# Patient Record
Sex: Female | Born: 1965
Health system: Southern US, Community
[De-identification: ages and names within clinical notes are randomized; demographics above are authoritative.]

## PROBLEM LIST (undated history)

## (undated) DIAGNOSIS — E559 Vitamin D deficiency, unspecified: Secondary | ICD-10-CM

## (undated) DIAGNOSIS — S92909A Unspecified fracture of unspecified foot, initial encounter for closed fracture: Secondary | ICD-10-CM

## (undated) DIAGNOSIS — M810 Age-related osteoporosis without current pathological fracture: Secondary | ICD-10-CM

## (undated) DIAGNOSIS — Z9109 Other allergy status, other than to drugs and biological substances: Secondary | ICD-10-CM

## (undated) HISTORY — DX: Other allergy status, other than to drugs and biological substances: Z91.09

## (undated) HISTORY — DX: Unspecified fracture of unspecified foot, initial encounter for closed fracture: S92.909A

## (undated) HISTORY — DX: Age-related osteoporosis without current pathological fracture: M81.0

## (undated) HISTORY — DX: Vitamin D deficiency, unspecified: E55.9

## (undated) HISTORY — PX: UMBILICAL HERNIA REPAIR: SHX196

---

## 1999-02-16 ENCOUNTER — Other Ambulatory Visit: Admission: RE | Admit: 1999-02-16 | Discharge: 1999-02-16 | Payer: Self-pay | Admitting: Gynecology

## 2002-02-25 ENCOUNTER — Other Ambulatory Visit: Admission: RE | Admit: 2002-02-25 | Discharge: 2002-02-25 | Payer: Self-pay | Admitting: Obstetrics and Gynecology

## 2002-10-02 HISTORY — PX: TONSILLECTOMY: SHX5217

## 2003-02-26 ENCOUNTER — Other Ambulatory Visit: Admission: RE | Admit: 2003-02-26 | Discharge: 2003-02-26 | Payer: Self-pay | Admitting: Obstetrics and Gynecology

## 2004-03-28 ENCOUNTER — Other Ambulatory Visit: Admission: RE | Admit: 2004-03-28 | Discharge: 2004-03-28 | Payer: Self-pay | Admitting: Gynecology

## 2005-03-29 ENCOUNTER — Other Ambulatory Visit: Admission: RE | Admit: 2005-03-29 | Discharge: 2005-03-29 | Payer: Self-pay | Admitting: Gynecology

## 2005-10-02 HISTORY — PX: GALLBLADDER SURGERY: SHX652

## 2006-03-30 ENCOUNTER — Other Ambulatory Visit: Admission: RE | Admit: 2006-03-30 | Discharge: 2006-03-30 | Payer: Self-pay | Admitting: Gynecology

## 2006-08-02 ENCOUNTER — Ambulatory Visit (HOSPITAL_COMMUNITY): Admission: RE | Admit: 2006-08-02 | Discharge: 2006-08-02 | Payer: Self-pay | Admitting: Gynecology

## 2006-08-16 ENCOUNTER — Encounter: Admission: RE | Admit: 2006-08-16 | Discharge: 2006-08-16 | Payer: Self-pay | Admitting: Gynecology

## 2007-04-01 ENCOUNTER — Other Ambulatory Visit: Admission: RE | Admit: 2007-04-01 | Discharge: 2007-04-01 | Payer: Self-pay | Admitting: Gynecology

## 2007-10-03 HISTORY — PX: CERVICAL DISC SURGERY: SHX588

## 2008-05-26 ENCOUNTER — Ambulatory Visit (HOSPITAL_COMMUNITY): Admission: RE | Admit: 2008-05-26 | Discharge: 2008-05-27 | Payer: Self-pay | Admitting: Neurosurgery

## 2008-07-15 ENCOUNTER — Ambulatory Visit: Payer: Self-pay | Admitting: Gynecology

## 2008-07-15 ENCOUNTER — Other Ambulatory Visit: Admission: RE | Admit: 2008-07-15 | Discharge: 2008-07-15 | Payer: Self-pay | Admitting: Gynecology

## 2008-07-15 ENCOUNTER — Encounter: Payer: Self-pay | Admitting: Gynecology

## 2008-09-10 ENCOUNTER — Ambulatory Visit (HOSPITAL_COMMUNITY): Admission: RE | Admit: 2008-09-10 | Discharge: 2008-09-10 | Payer: Self-pay | Admitting: Gynecology

## 2008-10-02 HISTORY — PX: ENDOMETRIAL ABLATION: SHX621

## 2009-05-25 IMAGING — MG MM DIGITAL SCREENING BILAT
5 series · 5 of 5 positions shown · non-contrast
Comparison: none

DG SCREEN MAMMOGRAM BILATERAL
Bilateral CC and MLO view(s) were taken.

DIGITAL SCREENING MAMMOGRAM WITH CAD:
The breast tissue is heterogeneously dense.  No masses or malignant type calcifications are 
identified.  Compared with prior studies.

[R CC]
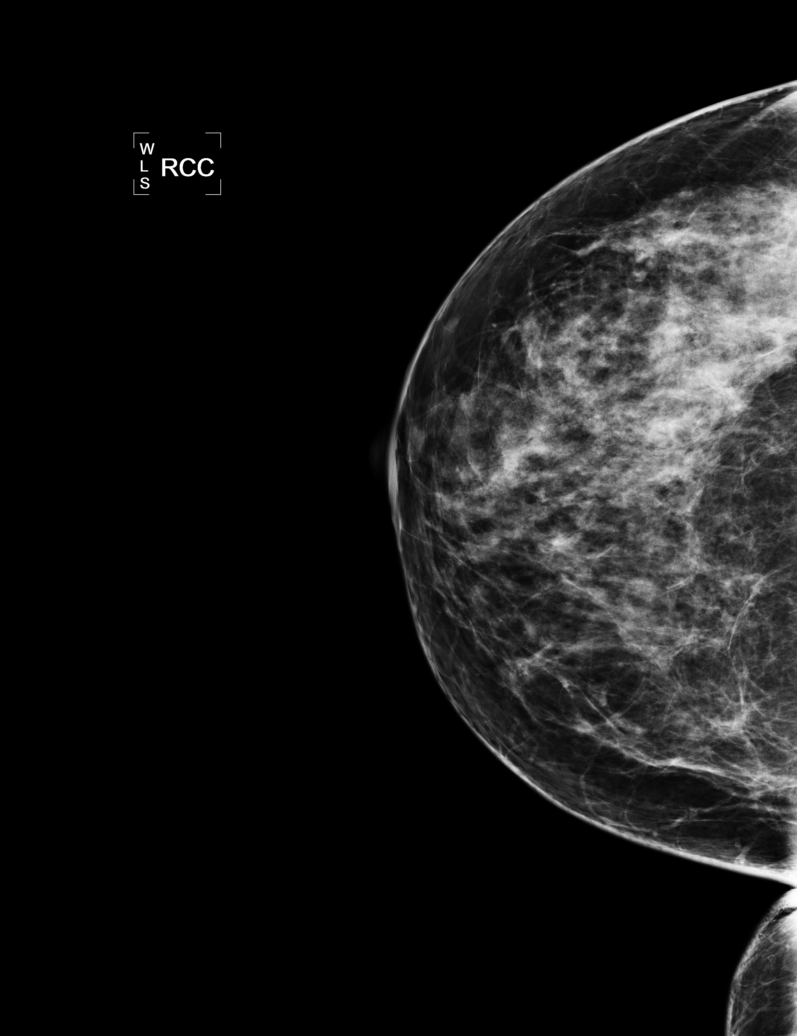

[R MLO (1 of 2)]
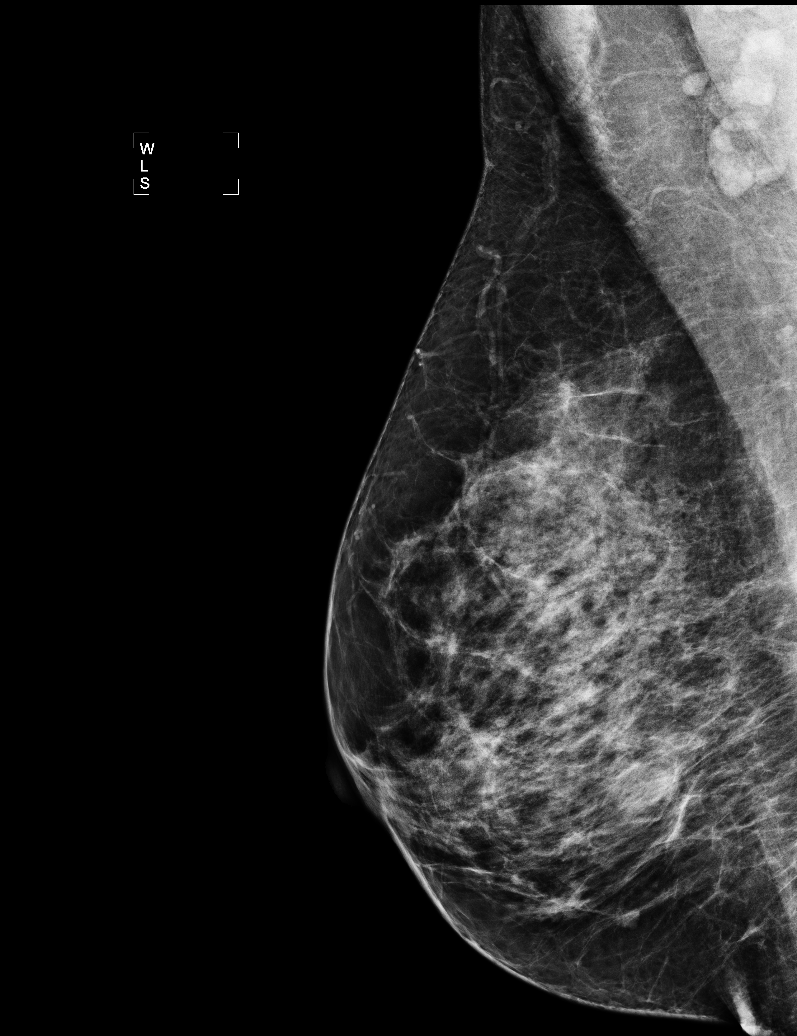

[L CC]
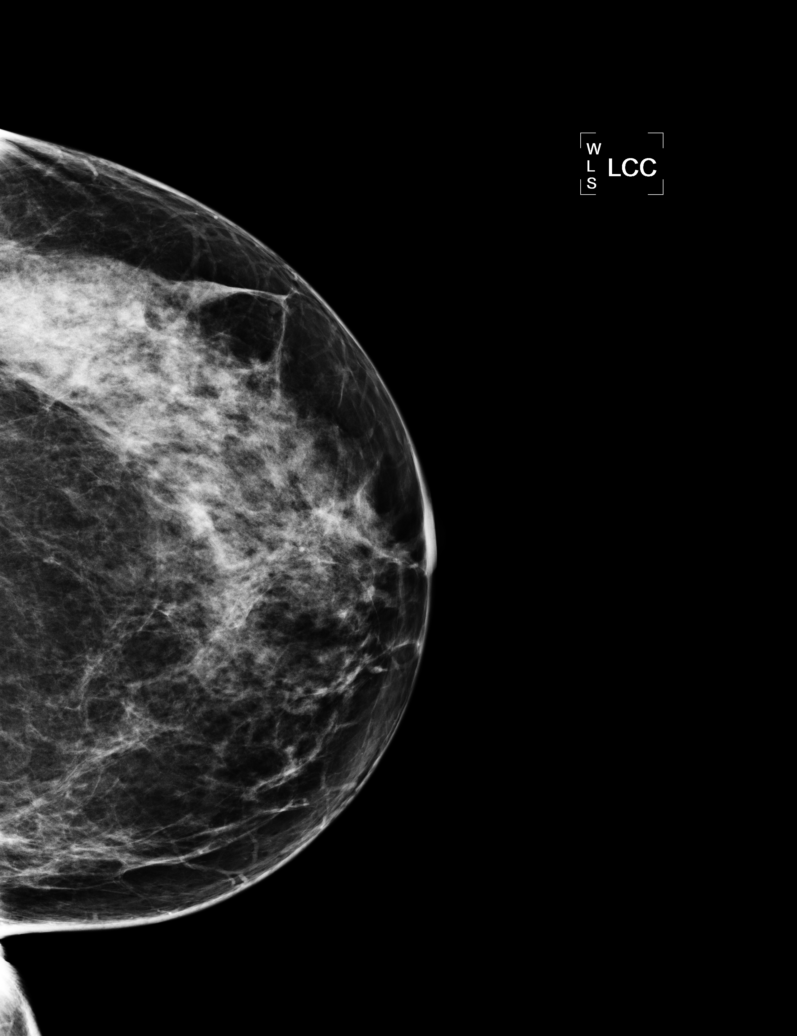

[L MLO]
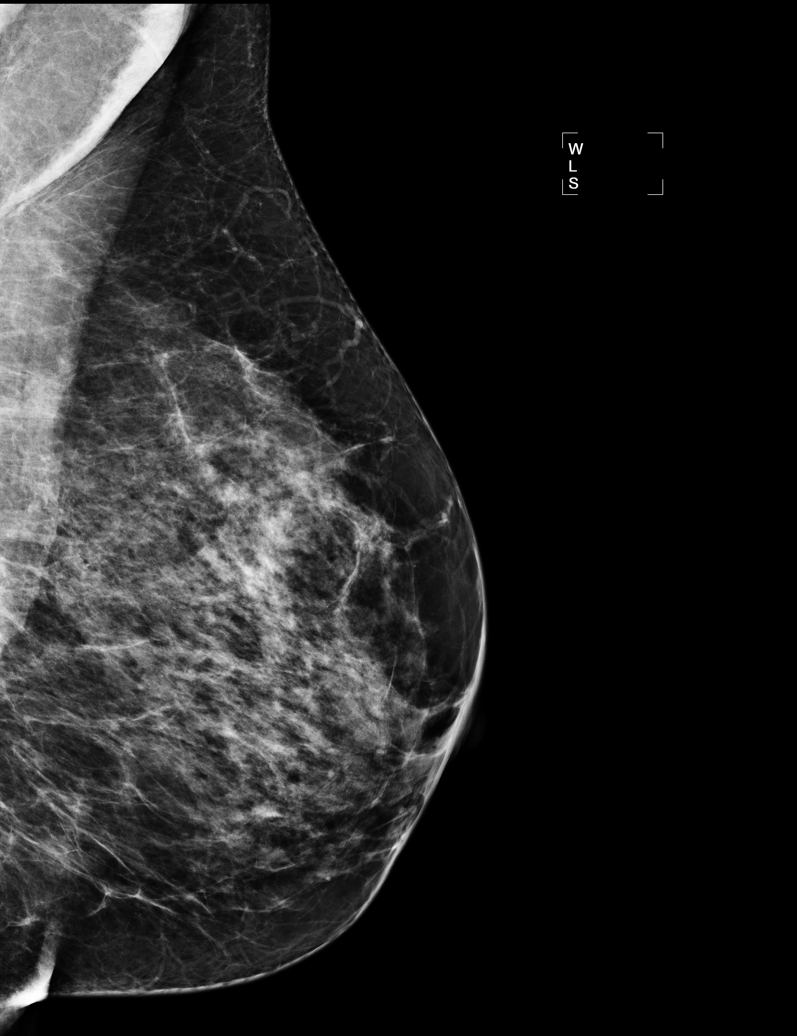

[R MLO (2 of 2)]
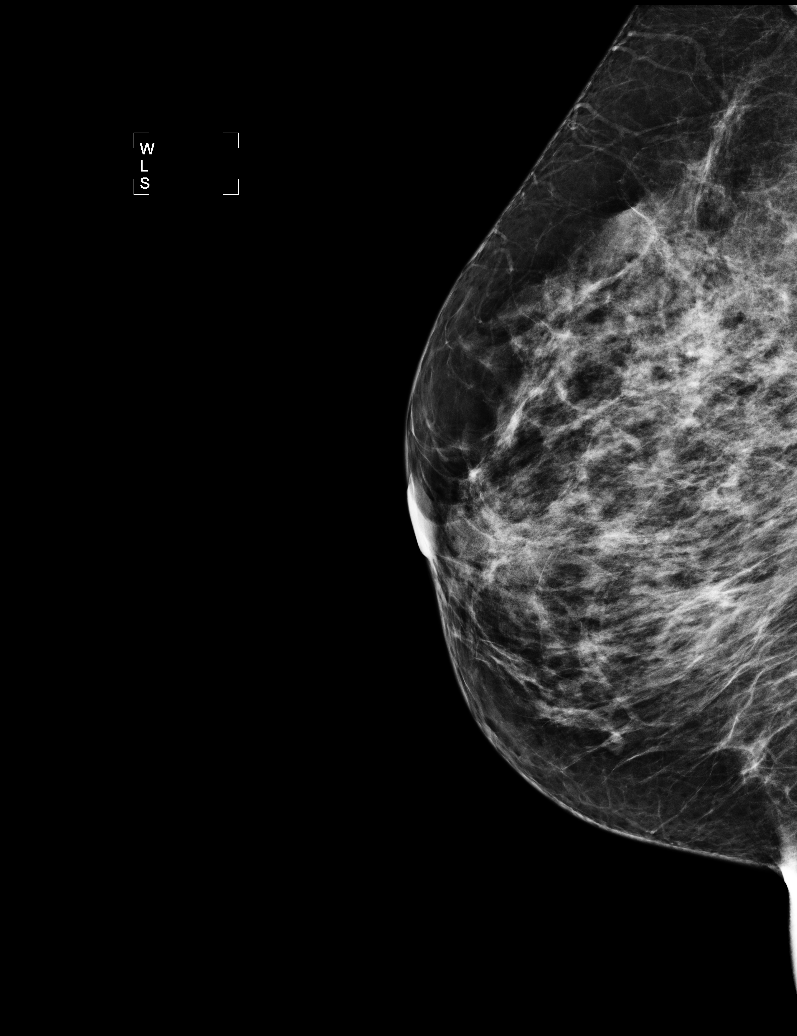

[5 of 5 positions shown; findings below may reference images not displayed]

IMPRESSION: No specific mammographic evidence of malignancy.  Next screening mammogram is recommended in one 
year.

ASSESSMENT: Negative - BI-RADS 1

Screening mammogram in 1 year.
ANALYZED BY COMPUTER AIDED DETECTION. , THIS PROCEDURE WAS A DIGITAL MAMMOGRAM.

## 2009-06-02 ENCOUNTER — Ambulatory Visit: Payer: Self-pay | Admitting: Gynecology

## 2009-06-08 ENCOUNTER — Ambulatory Visit: Payer: Self-pay | Admitting: Gynecology

## 2009-06-17 ENCOUNTER — Ambulatory Visit: Payer: Self-pay | Admitting: Gynecology

## 2009-06-18 ENCOUNTER — Ambulatory Visit (HOSPITAL_BASED_OUTPATIENT_CLINIC_OR_DEPARTMENT_OTHER): Admission: RE | Admit: 2009-06-18 | Discharge: 2009-06-18 | Payer: Self-pay | Admitting: Gynecology

## 2009-06-18 ENCOUNTER — Ambulatory Visit: Payer: Self-pay | Admitting: Gynecology

## 2009-07-29 ENCOUNTER — Encounter: Payer: Self-pay | Admitting: Gynecology

## 2009-07-29 ENCOUNTER — Other Ambulatory Visit: Admission: RE | Admit: 2009-07-29 | Discharge: 2009-07-29 | Payer: Self-pay | Admitting: Gynecology

## 2009-07-29 ENCOUNTER — Ambulatory Visit: Payer: Self-pay | Admitting: Gynecology

## 2009-10-07 ENCOUNTER — Ambulatory Visit (HOSPITAL_COMMUNITY): Admission: RE | Admit: 2009-10-07 | Discharge: 2009-10-07 | Payer: Self-pay | Admitting: Gynecology

## 2009-11-18 ENCOUNTER — Ambulatory Visit: Payer: Self-pay | Admitting: Gynecology

## 2009-11-25 ENCOUNTER — Ambulatory Visit: Payer: Self-pay | Admitting: Gynecology

## 2009-11-25 ENCOUNTER — Ambulatory Visit (HOSPITAL_BASED_OUTPATIENT_CLINIC_OR_DEPARTMENT_OTHER): Admission: RE | Admit: 2009-11-25 | Discharge: 2009-11-26 | Payer: Self-pay | Admitting: Gynecology

## 2009-11-25 HISTORY — PX: VAGINAL HYSTERECTOMY: SUR661

## 2009-12-08 ENCOUNTER — Ambulatory Visit: Payer: Self-pay | Admitting: Gynecology

## 2010-01-05 ENCOUNTER — Ambulatory Visit: Payer: Self-pay | Admitting: Gynecology

## 2010-07-04 ENCOUNTER — Encounter: Admission: RE | Admit: 2010-07-04 | Discharge: 2010-07-04 | Payer: Self-pay

## 2010-10-23 ENCOUNTER — Encounter: Payer: Self-pay | Admitting: Gynecology

## 2010-12-21 LAB — CBC
MCHC: 33.9 g/dL (ref 30.0–36.0)
MCV: 88.9 fL (ref 78.0–100.0)
Platelets: 246 10*3/uL (ref 150–400)

## 2011-01-06 LAB — POCT I-STAT 4, (NA,K, GLUC, HGB,HCT)
Glucose, Bld: 77 mg/dL (ref 70–99)
HCT: 47 % — ABNORMAL HIGH (ref 36.0–46.0)
Hemoglobin: 16 g/dL — ABNORMAL HIGH (ref 12.0–15.0)
Sodium: 140 mEq/L (ref 135–145)

## 2011-01-06 LAB — URINALYSIS, MICROSCOPIC ONLY
Ketones, ur: NEGATIVE mg/dL
Leukocytes, UA: NEGATIVE
Nitrite: NEGATIVE
Protein, ur: NEGATIVE mg/dL

## 2011-02-14 NOTE — Op Note (Signed)
NAMEKENNISHA, Allison Neal             ACCOUNT NO.:  [id number]   MEDICAL RECORD NO.:  [id number]          PATIENT TYPE:  OIB   LOCATION:  3523                         FACILITY:  [facility]   PHYSICIAN:  Cristi Loron, M.D.DATE OF BIRTH:  [birth year]   DATE OF PROCEDURE:  05/26/2008  DATE OF DISCHARGE:                               OPERATIVE REPORT   BRIEF HISTORY:  The patient is a 45 year old white female who has  suffered from neck and left arm pain consistent with a left C7  radiculopathy.  She failed medical management, was worked up with a  cervical MRI, which demonstrated a large herniated disk at C6-C7 on  left.  I discussed the various treatment options with the patient  including surgery.  She has weighed the risks, benefits, and  alternatives of surgery, and decided to proceed with a C6-C7 anterior  cervical diskectomy with fusion plating.   PREOPERATIVE DIAGNOSIS:  Left C6-C7 hernia exposed, history of  spondylosis, cervical radiculopathy, and cervicalgia.   POSTOPERATIVE DIAGNOSIS:  Left C6-C7 hernia exposed, history of  spondylosis, cervical radiculopathy, and cervicalgia   PROCEDURE:  C6-C7 extensive anterior cervical diskectomy/decompression;  C6-C7 anterior interbody local autograft arthrodesis; insertion of C6-C7  interbody prosthesis (Alphatec PEEK interbody prosthesis); C6-C7  anterior instrumentation plating with Codman Slim-Loc titanium plate and  screws.   SURGEON:  Cristi Loron, MD.   ASSISTANT:  Payton Doughty, MD.   ANESTHESIA:  General endotracheal.   ESTIMATED BLOOD LOSS:  50 mL.   SPECIMENS:  None.   DRAINS:  None.   COMPLICATIONS:  None.   DESCRIPTION OF PROCEDURE:  The patient was brought to the operative room  by the anesthesia team.  General endotracheal anesthesia was induced.  The patient remain in supine position.  A roll was placed under her  shoulders and placed her neck in slight extension.  Her anterior  cervical region was then  prepared with Betadine scrub with Betadine  solution.  Sterile drapes were applied.  I then injected the area to be  incised with Marcaine with epinephrine solution.  I used a scalpel to  make a transverse incision in the patient's left anterior neck.  I used  the Metzenbaum scissors to divide the platysma muscle and then to  dissect medial sternocleidomastoid muscle, jugular vein, and carotid  artery.  I carefully dissected down towards the anterior cervical spine  identifying the esophagus to retract it medially.  I then used skin  swabs to clear the soft tissue from the anterior cervical spine, and  then inserted bent spinal needle into the exposed intervertebral disk  space.  We obtained intraoperative radiograph to confirm location.   I then used electrocautery to detach the medial border of the longus  colli muscle bilaterally from C6-C7 intervertebral disk space.  I then  inserted the Caspar self-retaining retractor underneath the longus colli  muscle bilaterally to provide exposure and then incised C6-C7  intervertebral disk with a #15 blade scalpel.  Then, performed a partial  intervertebral diskectomy using pituitary forceps.  I then inserted  traction screws to C6-C7, distracted interspace, and then  used a high-  speed drill to decorticate the vertebral endplates at C6-C7.  Drilled  away the remaining of C6-C7 intervertebral disk and thinned out the  posterior longitudinal ligament to remove some ventral spondylosis.  I  then used arachnoid knife to incise and thin out posterior longitudinal  ligament, then removed it with a Kerrison punch undercutting the  vertebral endplates at C6-C7 decompressing the thecal sac.  I then  performed foraminotomies above the bilateral C7 nerve root.  Of note, we  encountered a large herniated disk at C6-C7 on the left as I suspected.  We removed multiple fragments with the pituitary forceps.  We got a good  decompression of thecal sac and  bilateral C7 nerve roots.   We now turned attention to the arthrodesis.  We used the trial spacers  and determined to use a 5 mm medium PEEK interbody prosthesis.  We  prefilled this prosthesis with combination of local autograft bone.  We  obtained the decompression as well as Actifuse bone graft extender.  We  inserted the prosthesis into the distracted C6-C7 interspace, and then  removed distraction screws. There was a good snug fit of the prosthesis.   We now turned attention to the anterior spinal instrumentation.  We  selected the appropriate-length Codman Slim-Loc anterior cervical plate  and we laid it along the anterior aspect of the vertebral bodies at C6-  C7.  We drilled two 12-mm holes at C6-C7 and then secured the plate to  the vertebral bodies by placing two 12-mm screws at C6-C7.  We obtained  intraoperative radiograph, which demonstrated good positioning of  plates, screws, and interbody prosthesis, and secured these screws and  plate by locking each cam.  This completed the instrumentation.   We then obtained hemostasis using bipolar electrocautery.  We irrigated  the wound out with bacitracin solution, removed the retractors.  We then  inspected the esophagus for any damage, there was none apparent.  We  then reapproximated the patient's platysmal muscle with interrupted 3-0  Vicryl suture, the subcutaneous suture with interrupted 3-0 Vicryl  suture, and the skin with Steri-Strips and Benzoin.  The wound was then  coated with bacitracin ointment.  A sterile dressing was applied.  The  drapes were removed.  The patient was subsequently extubated by  anesthesia team and transported to the post anesthesia care unit in  stable condition.  All sponge, instrument, and needle counts were  correct at the end of this case.      Cristi Loron, M.D.  Electronically Signed     JDJ/MEDQ  D:  05/26/2008  T:  05/27/2008  Job:  [id number]

## 2011-12-04 ENCOUNTER — Encounter: Payer: Self-pay | Admitting: Women's Health

## 2011-12-04 ENCOUNTER — Ambulatory Visit (INDEPENDENT_AMBULATORY_CARE_PROVIDER_SITE_OTHER): Payer: BC Managed Care – PPO | Admitting: Women's Health

## 2011-12-04 VITALS — BP 90/62 | Ht 67.0 in | Wt 137.5 lb

## 2011-12-04 DIAGNOSIS — B373 Candidiasis of vulva and vagina: Secondary | ICD-10-CM

## 2011-12-04 DIAGNOSIS — Z13220 Encounter for screening for lipoid disorders: Secondary | ICD-10-CM

## 2011-12-04 DIAGNOSIS — Z833 Family history of diabetes mellitus: Secondary | ICD-10-CM

## 2011-12-04 DIAGNOSIS — Z01419 Encounter for gynecological examination (general) (routine) without abnormal findings: Secondary | ICD-10-CM

## 2011-12-04 LAB — WET PREP FOR TRICH, YEAST, CLUE

## 2011-12-04 MED ORDER — FLUCONAZOLE 150 MG PO TABS: 150.0000 mg | ORAL_TABLET | Freq: Once | ORAL | Status: AC

## 2011-12-04 NOTE — Progress Notes (Signed)
Allison Neal September 14, 1966 [id number]    History:    The patient presents for annual exam. TVH in 2011 for menorrhagia and DUB. Complained of discharge with vaginal itching. Denies any menopausal symptoms. History normal Paps and mammograms.  Past medical history, past surgical history, family history and social history were all reviewed and documented in the EPIC chart.   ROS:  A  ROS was performed and pertinent positives and negatives are included in the history.  Exam:  Filed Vitals:   12/04/11 0956  BP: 90/62    General appearance:  Normal Head/Neck:  Normal, without cervical or supraclavicular adenopathy. Thyroid:  Symmetrical, normal in size, without palpable masses or nodularity. Respiratory  Effort:  Normal  Auscultation:  Clear without wheezing or rhonchi Cardiovascular  Auscultation:  Regular rate, without rubs, murmurs or gallops  Edema/varicosities:  Not grossly evident Abdominal  Soft,nontender, without masses, guarding or rebound.  Liver/spleen:  No organomegaly noted  Hernia:  None appreciated  Skin  Inspection:  Grossly normal  Palpation:  Grossly normal Neurologic/psychiatric  Orientation:  Normal with appropriate conversation.  Mood/affect:  Normal  Genitourinary    Breasts: Examined lying and sitting.     Right: Without masses, retractions, discharge or axillary adenopathy.     Left: Without masses, retractions, discharge or axillary adenopathy.   Inguinal/mons:  Normal without inguinal adenopathy  External genitalia:  erythematous  BUS/Urethra/Skene's glands:  Normal  Bladder:  Normal  Vagina:  Normal  Cervix: Absent  Uterus:   Adnexa/parametria:     Rt: Without masses or tenderness.   Lt: Without masses or tenderness.  Anus and perineum: Normal  Digital rectal exam: Normal sphincter tone without palpated masses or tenderness  Assessment/Plan:  46 y.o. Allison Neal for annual exam with complaints of vaginal itching and  irritation.  Yeast TVH  Plan: Diflucan 150 by mouth x1 dose with refill. Instructed to call if no relief. SBE's, annual mammogram which is overdue, instructed to schedule, exercise, calcium rich diet, MVI daily encouraged. CBC, glucose, lipid profile, UA.   Luay Balding J WHNP, 1:14 PM 12/04/2011

## 2011-12-05 LAB — URINALYSIS W MICROSCOPIC + REFLEX CULTURE
Bilirubin Urine: NEGATIVE
Casts: NONE SEEN
Crystals: NONE SEEN
Glucose, UA: NEGATIVE mg/dL
Protein, ur: NEGATIVE mg/dL
Specific Gravity, Urine: 1.019 (ref 1.005–1.030)
pH: 6.5 (ref 5.0–8.0)

## 2011-12-05 LAB — CBC WITH DIFFERENTIAL/PLATELET
Basophils Absolute: 0 10*3/uL (ref 0.0–0.1)
Eosinophils Absolute: 0.2 10*3/uL (ref 0.0–0.7)
Lymphs Abs: 2.8 10*3/uL (ref 0.7–4.0)
MCH: 28.1 pg (ref 26.0–34.0)
Neutrophils Relative %: 61 % (ref 43–77)
Platelets: 285 10*3/uL (ref 150–400)
RBC: 4.74 MIL/uL (ref 3.87–5.11)
RDW: 14.6 % (ref 11.5–15.5)
WBC: 9 10*3/uL (ref 4.0–10.5)

## 2011-12-05 LAB — LIPID PANEL
Cholesterol: 143 mg/dL (ref 0–200)
Total CHOL/HDL Ratio: 2.8 Ratio

## 2011-12-07 LAB — URINE CULTURE: Colony Count: 15000

## 2012-11-22 ENCOUNTER — Other Ambulatory Visit: Payer: Self-pay | Admitting: Gynecology

## 2012-11-22 DIAGNOSIS — Z1231 Encounter for screening mammogram for malignant neoplasm of breast: Secondary | ICD-10-CM

## 2012-12-09 ENCOUNTER — Ambulatory Visit (HOSPITAL_COMMUNITY)
Admission: RE | Admit: 2012-12-09 | Discharge: 2012-12-09 | Disposition: A | Discharge status: Home / Self Care | Payer: BC Managed Care – PPO | Source: Ambulatory Visit | Attending: Gynecology | Admitting: Gynecology

## 2012-12-09 ENCOUNTER — Ambulatory Visit (INDEPENDENT_AMBULATORY_CARE_PROVIDER_SITE_OTHER): Payer: BC Managed Care – PPO | Admitting: Women's Health

## 2012-12-09 ENCOUNTER — Encounter: Payer: Self-pay | Admitting: Women's Health

## 2012-12-09 VITALS — BP 96/60 | Ht 66.75 in | Wt 123.0 lb

## 2012-12-09 DIAGNOSIS — Z1231 Encounter for screening mammogram for malignant neoplasm of breast: Secondary | ICD-10-CM

## 2012-12-09 DIAGNOSIS — E079 Disorder of thyroid, unspecified: Secondary | ICD-10-CM

## 2012-12-09 DIAGNOSIS — Z833 Family history of diabetes mellitus: Secondary | ICD-10-CM

## 2012-12-09 DIAGNOSIS — Z01419 Encounter for gynecological examination (general) (routine) without abnormal findings: Secondary | ICD-10-CM

## 2012-12-09 LAB — CBC WITH DIFFERENTIAL/PLATELET
Basophils Relative: 1 % (ref 0–1)
Eosinophils Absolute: 0.1 10*3/uL (ref 0.0–0.7)
Hemoglobin: 13.7 g/dL (ref 12.0–15.0)
Lymphs Abs: 2.3 10*3/uL (ref 0.7–4.0)
MCH: 28 pg (ref 26.0–34.0)
Monocytes Relative: 8 % (ref 3–12)
Neutro Abs: 3 10*3/uL (ref 1.7–7.7)
Neutrophils Relative %: 50 % (ref 43–77)
Platelets: 278 10*3/uL (ref 150–400)
RBC: 4.9 MIL/uL (ref 3.87–5.11)

## 2012-12-09 NOTE — Patient Instructions (Addendum)

## 2012-12-09 NOTE — Progress Notes (Signed)
Allison Neal [birth year] [id number]    History:    The patient presents for annual exam.  TVH for menorrhagia and DUB 2/11- Dr. Lily Peer. Normal mammogram 2011, had one done this a.m. History of normal Paps. Normal lipid panel last year. 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   Past medical history, past surgical history, family history and social history were all reviewed and documented in the EPIC chart. 3 children all doing well, has 2 grandchildren. History of a cholecystectomy 06, appendectomy 80.  Mother, sister, grandmother with diabetes.   ROS:  A  ROS was performed and pertinent positives and negatives are included in the history.  Exam:  Filed Vitals:   12/09/12 0817  BP: 96/60    General appearance:  Normal Head/Neck:  Normal, without cervical or supraclavicular adenopathy. Thyroid:  Symmetrical, normal in size, without palpable masses or nodularity. Respiratory  Effort:  Normal  Auscultation:  Clear without wheezing or rhonchi Cardiovascular  Auscultation:  Regular rate, without rubs, murmurs or gallops  Edema/varicosities:  Not grossly evident Abdominal  Soft,nontender, without masses, guarding or rebound.  Liver/spleen:  No organomegaly noted  Hernia:  None appreciated  Skin  Inspection:  Grossly normal  Palpation:  Grossly normal Neurologic/psychiatric  Orientation:  Normal with appropriate conversation.  Mood/affect:  Normal  Genitourinary    Breasts: Examined lying and sitting.     Right: Without masses, retractions, discharge or axillary adenopathy.     Left: Without masses, retractions, discharge or axillary adenopathy.   Inguinal/mons:  Normal without inguinal adenopathy  External genitalia:  Normal  BUS/Urethra/Skene's glands:  Normal  Bladder:  Normal  Vagina:  Normal  Cervix:  absent  Uterus:  absent  Adnexa/parametria:     Rt: Without masses or tenderness.   Lt: Without masses or  tenderness.  Anus and perineum: Normal  Digital rectal exam: Normal sphincter tone without palpated masses or tenderness  Assessment/Plan:  47 y.o. MWF G3 P35for annual exam with no complaints.  Normal GYN exam/TVH  Plan: SBE's, continue annual mammogram, exercise, calcium rich diet, vitamin D 2000 daily encouraged. Reviewed importance of regular meals with healthy snacks to maintain weight. CBC, TSH, glucose, UA,    Harrington Challenger [facility], 8:49 AM 12/09/2012

## 2012-12-10 LAB — URINALYSIS W MICROSCOPIC + REFLEX CULTURE
Bacteria, UA: NONE SEEN
Hgb urine dipstick: NEGATIVE
Ketones, ur: NEGATIVE mg/dL
Leukocytes, UA: NEGATIVE
Nitrite: NEGATIVE
Specific Gravity, Urine: 1.028 (ref 1.005–1.030)
Urobilinogen, UA: 0.2 mg/dL (ref 0.0–1.0)
pH: 5.5 (ref 5.0–8.0)

## 2012-12-10 LAB — TSH: TSH: 1.365 u[IU]/mL (ref 0.350–4.500)

## 2012-12-10 LAB — GLUCOSE, RANDOM: Glucose, Bld: 82 mg/dL (ref 70–99)

## 2012-12-12 ENCOUNTER — Other Ambulatory Visit: Payer: Self-pay | Admitting: Gynecology

## 2012-12-12 ENCOUNTER — Other Ambulatory Visit: Payer: Self-pay | Admitting: *Deleted

## 2012-12-12 DIAGNOSIS — N6320 Unspecified lump in the left breast, unspecified quadrant: Secondary | ICD-10-CM

## 2012-12-12 DIAGNOSIS — R928 Other abnormal and inconclusive findings on diagnostic imaging of breast: Secondary | ICD-10-CM

## 2012-12-13 ENCOUNTER — Encounter: Payer: Self-pay | Admitting: Gynecology

## 2012-12-30 ENCOUNTER — Ambulatory Visit
Admission: RE | Admit: 2012-12-30 | Discharge: 2012-12-30 | Disposition: A | Discharge status: Home / Self Care | Payer: BC Managed Care – PPO | Source: Ambulatory Visit | Attending: Gynecology | Admitting: Gynecology

## 2012-12-30 DIAGNOSIS — N6320 Unspecified lump in the left breast, unspecified quadrant: Secondary | ICD-10-CM

## 2012-12-30 DIAGNOSIS — R928 Other abnormal and inconclusive findings on diagnostic imaging of breast: Secondary | ICD-10-CM

## 2013-08-07 ENCOUNTER — Other Ambulatory Visit: Payer: Self-pay

## 2013-12-15 ENCOUNTER — Encounter: Payer: Self-pay | Admitting: Gynecology

## 2013-12-15 ENCOUNTER — Ambulatory Visit (INDEPENDENT_AMBULATORY_CARE_PROVIDER_SITE_OTHER): Payer: BC Managed Care – PPO | Admitting: Gynecology

## 2013-12-15 VITALS — BP 102/70 | Ht 66.5 in | Wt 119.0 lb

## 2013-12-15 DIAGNOSIS — IMO0002 Reserved for concepts with insufficient information to code with codable children: Secondary | ICD-10-CM

## 2013-12-15 DIAGNOSIS — Z01419 Encounter for gynecological examination (general) (routine) without abnormal findings: Secondary | ICD-10-CM

## 2013-12-15 DIAGNOSIS — N898 Other specified noninflammatory disorders of vagina: Secondary | ICD-10-CM

## 2013-12-15 DIAGNOSIS — N951 Menopausal and female climacteric states: Secondary | ICD-10-CM

## 2013-12-15 LAB — CBC WITH DIFFERENTIAL/PLATELET
Basophils Absolute: 0.1 10*3/uL (ref 0.0–0.1)
Basophils Relative: 1 % (ref 0–1)
EOS PCT: 2 % (ref 0–5)
Eosinophils Absolute: 0.1 10*3/uL (ref 0.0–0.7)
HEMATOCRIT: 41.9 % (ref 36.0–46.0)
HEMOGLOBIN: 14.1 g/dL (ref 12.0–15.0)
LYMPHS PCT: 45 % (ref 12–46)
Lymphs Abs: 3 10*3/uL (ref 0.7–4.0)
MCH: 29 pg (ref 26.0–34.0)
MCHC: 33.7 g/dL (ref 30.0–36.0)
MCV: 86 fL (ref 78.0–100.0)
MONO ABS: 0.4 10*3/uL (ref 0.1–1.0)
MONOS PCT: 6 % (ref 3–12)
NEUTROS ABS: 3 10*3/uL (ref 1.7–7.7)
Neutrophils Relative %: 46 % (ref 43–77)
Platelets: 267 10*3/uL (ref 150–400)
RBC: 4.87 MIL/uL (ref 3.87–5.11)
RDW: 14.7 % (ref 11.5–15.5)
WBC: 6.6 10*3/uL (ref 4.0–10.5)

## 2013-12-15 LAB — WET PREP FOR TRICH, YEAST, CLUE
CLUE CELLS WET PREP: NONE SEEN
Trich, Wet Prep: NONE SEEN
Yeast Wet Prep HPF POC: NONE SEEN

## 2013-12-15 LAB — COMPREHENSIVE METABOLIC PANEL
ALBUMIN: 4.5 g/dL (ref 3.5–5.2)
ALT: 10 U/L (ref 0–35)
AST: 18 U/L (ref 0–37)
Alkaline Phosphatase: 58 U/L (ref 39–117)
BUN: 13 mg/dL (ref 6–23)
CALCIUM: 9.1 mg/dL (ref 8.4–10.5)
CHLORIDE: 102 meq/L (ref 96–112)
CO2: 26 mEq/L (ref 19–32)
CREATININE: 0.7 mg/dL (ref 0.50–1.10)
GLUCOSE: 72 mg/dL (ref 70–99)
POTASSIUM: 4 meq/L (ref 3.5–5.3)
Sodium: 140 mEq/L (ref 135–145)
Total Bilirubin: 0.7 mg/dL (ref 0.2–1.2)
Total Protein: 7.3 g/dL (ref 6.0–8.3)

## 2013-12-15 LAB — LIPID PANEL
CHOLESTEROL: 171 mg/dL (ref 0–200)
HDL: 71 mg/dL (ref >39)
LDL Cholesterol: 86 mg/dL (ref 0–99)
TRIGLYCERIDES: 68 mg/dL (ref <150)
Total CHOL/HDL Ratio: 2.4 Ratio
VLDL: 14 mg/dL (ref 0–40)

## 2013-12-15 MED ORDER — OSPEMIFENE 60 MG PO TABS: 60.0000 mg | ORAL_TABLET | Freq: Every day | ORAL | Status: DC

## 2013-12-15 NOTE — Patient Instructions (Signed)
Bone Densitometry Bone densitometry is a special X-ray that measures your bone density and can be used to help predict your risk of bone fractures. This test is used to determine bone mineral content and density to diagnose osteoporosis. Osteoporosis is the loss of bone that may cause the bone to become weak. Osteoporosis commonly occurs in women entering menopause. However, it may be found in men and in people with other diseases. PREPARATION FOR TEST No preparation necessary. WHO SHOULD BE TESTED?  All women older than 65.  Postmenopausal women (50 to 28) with risk factors for osteoporosis.  People with a previous fracture caused by normal activities.  People with a small body frame (less than 127 poundsor a body mass index [BMI] of less than 21).  People who have a parent with a hip fracture or history of osteoporosis.  People who smoke.  People who have rheumatoid arthritis.  Anyone who engages in excessive alcohol use (more than 3 drinks most days).  Women who experience early menopause. WHEN SHOULD YOU BE RETESTED? Current guidelines suggest that you should wait at least 2 years before doing a bone density test again if your first test was normal.Recent studies indicated that women with normal bone density may be able to wait a few years before needing to repeat a bone density test. You should discuss this with your caregiver.  NORMAL FINDINGS   Normal: less than standard deviation below normal (greater than -1).  Osteopenia: 1 to 2.5 standard deviations below normal (-1 to -2.5).  Osteoporosis: greater than 2.5 standard deviations below normal (less than -2.5). Test results are reported as a "T score" and a "Z score."The T score is a number that compares your bone density with the bone density of healthy, young women.The Z score is a number that compares your bone density with the scores of women who are the same age, gender, and race.  Ranges for normal findings may vary  among different laboratories and hospitals. You should always check with your doctor after having lab work or other tests done to discuss the meaning of your test results and whether your values are considered within normal limits. MEANING OF TEST  Your caregiver will go over the test results with you and discuss the importance and meaning of your results, as well as treatment options and the need for additional tests if necessary. OBTAINING THE TEST RESULTS It is your responsibility to obtain your test results. Ask the lab or department performing the test when and how you will get your results. Document Released: 10/10/2004 Document Revised: 12/11/2011 Document Reviewed: 11/02/2010 [facility] Patient Information 2014 [place]. Ospemifene oral tablets What is this medicine? OSPEMIFENE (os PEM i feen) is used to treat painful sexual intercourse in females, a symptom of changes in and around the vagina during menopause. This medicine may be used for other purposes; ask your health care provider or pharmacist if you have questions. COMMON BRAND NAME(S): Osphena What should I tell my health care provider before I take this medicine? They need to know if you have any of these conditions: -cancer, such as breast, uterine, or other cancer -heart disease -history of blood clots -history of stroke -history of vaginal bleeding -liver disease -premenopausal -smoke tobacco -an unusual or allergic reaction to ospemifene, other medicines, foods, dyes, or preservatives -pregnant or trying to get pregnant -breast-feeding How should I use this medicine? Take this medicine by mouth with a glass of water. Take this medicine with food. Follow the directions  on the prescription label. Do not take your medicine more often than directed. Talk to your pediatrician regarding the use of this medicine in children. Special care may be needed. Overdosage: If you think you've taken too much of this medicine  contact a poison control center or emergency room at once. Overdosage: If you think you have taken too much of this medicine contact a poison control center or emergency room at once. NOTE: This medicine is only for you. Do not share this medicine with others. What if I miss a dose? If you miss a dose, take it as soon as you can. If it is almost time for your next dose, take only that dose. Do not take double or extra doses. What may interact with this medicine? -doxycycline -estrogens -fluconazole -furosemide -glyburide -ketoconazole -phenytoin -rifampin -warfarin This list may not describe all possible interactions. Give your health care provider a list of all the medicines, herbs, non-prescription drugs, or dietary supplements you use. Also tell them if you smoke, drink alcohol, or use illegal drugs. Some items may interact with your medicine. What should I watch for while using this medicine? Visit your health care professional for regular checks on your progress. You will need a regular breast and pelvic exam and Pap smear while on this medicine. You should also discuss the need for regular mammograms with your health care professional, and follow his or her guidelines for these tests. Also, periodically discuss the need to continue taking this medicine. Taking this medicine for long periods of time may increase your risk for serious side effects. This medicine can increase the risk of developing a condition (endometrial hyperplasia) that may lead to cancer of the lining of the uterus. Taking progestins, another hormone drug, with this medicine lowers the risk of developing this condition. Therefore, if your uterus has not been removed (by a hysterectomy), your doctor may prescribe a progestin for you to take together with your estrogen. You should know, however, that taking estrogens with progestins may have additional health risks. You should discuss the use of estrogens and progestins with  your health care professional to determine the benefits and risks for you. This medicine can rarely cause blood clots. You should avoid long periods of bed rest while taking this medicine. If you are going to have surgery, tell your doctor or health care professional that you are taking this medicine. This medicine should be stopped at least 4-6 weeks before surgery. After surgery, it should be restarted only after you are walking again. It should not be restarted while you still need long periods of bed rest. You should not smoke while taking this medicine. Smoking may also increase your risk of blood clots. Smoking can also decrease the effects of this medicine. This medicine does not prevent hot flashes. It may cause hot flashes in some patients. If you have any reason to think you are pregnant; stop taking this medicine at once and contact your doctor or health care professional. What side effects may I notice from receiving this medicine? Side effects that you should report to your doctor or health care professional as soon as possible: -breathing problems -changes in vision -confusion, trouble speaking or understanding -new breast lumps -pain, swelling, warmth in the leg -pelvic pain or pressure -severe headaches -sudden chest pain -sudden numbness or weakness of the face, arm or leg -trouble walking, dizziness, loss of balance or coordination -unusual vaginal bleeding patterns -vaginal discharge that is bloody or brown  Side effects  that usually do not require medical attention (Report these to your doctor or health care professional if they continue or are bothersome.): -hot flushes or flashes -increased sweating -muscle cramps -vaginal discharge (white or clear) This list may not describe all possible side effects. Call your doctor for medical advice about side effects. You may report side effects to FDA at 1-800-FDA-1088. Where should I keep my medicine? Keep out of the reach of  children. Store at room temperature between 20 and 25 degrees C (68 and 77 degrees F). Protect from light. Keep container tightly closed. Throw away any unused medicine after the expiration date. NOTE: This sheet is a summary. It may not cover all possible information. If you have questions about this medicine, talk to your doctor, pharmacist, or health care provider.  2014, Elsevier/Gold Standard. (2011-11-30 10:29:05) Perimenopause Perimenopause is the time when your body begins to move into the menopause (no menstrual period for 12 straight months). It is a natural process. Perimenopause can begin 2 8 years before the menopause and usually lasts for 1 year after the menopause. During this time, your ovaries may or may not produce an egg. The ovaries vary in their production of estrogen and progesterone hormones each month. This can cause irregular menstrual periods, difficulty getting pregnant, vaginal bleeding between periods, and uncomfortable symptoms. CAUSES  Irregular production of the ovarian hormones, estrogen and progesterone, and not ovulating every month.  Other causes include:  Tumor of the pituitary gland in the brain.  Medical disease that affects the ovaries.  Radiation treatment.  Chemotherapy.  Unknown causes.  Heavy smoking and excessive alcohol intake can bring on perimenopause sooner. SIGNS AND SYMPTOMS   Hot flashes.  Night sweats.  Irregular menstrual periods.  Decreased sex drive.  Vaginal dryness.  Headaches.  Mood swings.  Depression.  Memory problems.  Irritability.  Tiredness.  Weight gain.  Trouble getting pregnant.  The beginning of losing bone cells (osteoporosis).  The beginning of hardening of the arteries (atherosclerosis). DIAGNOSIS  Your health care provider will make a diagnosis by analyzing your age, menstrual history, and symptoms. He or she will do a physical exam and note any changes in your body, especially your female  organs. Female hormone tests may or may not be helpful depending on the amount of female hormones you produce and when you produce them. However, other hormone tests may be helpful to rule out other problems. TREATMENT  In some cases, no treatment is needed. The decision on whether treatment is necessary during the perimenopause should be made by you and your health care provider based on how the symptoms are affecting you and your lifestyle. Various treatments are available, such as:  Treating individual symptoms with a specific medicine for that symptom.  Herbal medicines that can help specific symptoms.  Counseling.  Group therapy. HOME CARE INSTRUCTIONS   Keep track of your menstrual periods (when they occur, how heavy they are, how long between periods, and how long they last) as well as your symptoms and when they started.  Only take over-the-counter or prescription medicines as directed by your health care provider.  Sleep and rest.  Exercise.  Eat a diet that contains calcium (good for your bones) and soy (acts like the estrogen hormone).  Do not smoke.  Avoid alcoholic beverages.  Take vitamin supplements as recommended by your health care provider. Taking vitamin E may help in certain cases.  Take calcium and vitamin D supplements to help prevent bone loss.  Group  therapy is sometimes helpful.  Acupuncture may help in some cases. SEEK MEDICAL CARE IF:   You have questions about any symptoms you are having.  You need a referral to a specialist (gynecologist, psychiatrist, or psychologist). SEEK IMMEDIATE MEDICAL CARE IF:   You have vaginal bleeding.  Your period lasts longer than 8 days.  Your periods are recurring sooner than 21 days.  You have bleeding after intercourse.  You have severe depression.  You have pain when you urinate.  You have severe headaches.  You have vision problems. Document Released: 10/26/2004 Document Revised: 07/09/2013  Document Reviewed: 04/17/2013 [facility] Patient Information 2014 [employer] [place].

## 2013-12-15 NOTE — Progress Notes (Signed)
Chevis PrettyRose Quiggle 26-Jan-1966 [id number]   History:    48 y.o.  for annual gyn exam with the only complain of dyspareunia for which she needs to use Vaseline. She is having minimal if any vasomotor symptoms. Patient has a history of a transvaginal hysterectomy for menorrhagia back in 2011 and has done well. Patient [place] this [place] which she states was normal. Patient would no prior history of abnormal Pap smears. Her last mammogram was in 2014. Patient's mother and grandmother both had osteoporosis.  Past medical history,surgical history, family history and social history were all reviewed and documented in the EPIC chart.  Gynecologic History No LMP recorded. Patient has had a hysterectomy. Contraception: status post hysterectomy Last Pap: 2010. Results were: normal Last mammogram: 2014. Results were: normal  Obstetric History OB History  Gravida Para Term Preterm AB SAB TAB Ectopic Multiple Living  3 3        3     # Outcome Date GA Lbr Len/2nd Weight Sex Delivery Anes PTL Lv  3 PAR           2 PAR           1 PAR                ROS: A ROS was performed and pertinent positives and negatives are included in the history.  GENERAL: No fevers or chills. HEENT: No change in vision, no earache, sore throat or sinus congestion. NECK: No pain or stiffness. CARDIOVASCULAR: No chest pain or pressure. No palpitations. PULMONARY: No shortness of breath, cough or wheeze. GASTROINTESTINAL: No abdominal pain, nausea, vomiting or diarrhea, melena or bright red blood per rectum. GENITOURINARY: No urinary frequency, urgency, hesitancy or dysuria. MUSCULOSKELETAL: No joint or muscle pain, no back pain, no recent trauma. DERMATOLOGIC: No rash, no itching, no lesions. ENDOCRINE: No polyuria, polydipsia, no heat or cold intolerance. No recent change in weight. HEMATOLOGICAL: No anemia or easy bruising or bleeding. NEUROLOGIC: No headache, seizures, numbness, tingling or weakness.  PSYCHIATRIC: No depression, no loss of interest in normal activity or change in sleep pattern.     Exam: chaperone present  BP 102/70  Ht 5' 6.5" (1.689 m)  Wt 119 lb (53.978 kg)  BMI 18.92 kg/m2  Body mass index is 18.92 kg/(m^2).  General appearance : Well developed well nourished female. No acute distress HEENT: Neck supple, trachea midline, no carotid bruits, no thyroidmegaly Lungs: Clear to auscultation, no rhonchi or wheezes, or rib retractions  Heart: Regular rate and rhythm, no murmurs or gallops Breast:Examined in sitting and supine position were symmetrical in appearance, no palpable masses or tenderness,  no skin retraction, no nipple inversion, no nipple discharge, no skin discoloration, no axillary or supraclavicular lymphadenopathy Abdomen: no palpable masses or tenderness, no rebound or guarding Extremities: no edema or skin discoloration or tenderness  Pelvic:  Bartholin, Urethra, Skene Glands: Within normal limits             Vagina: No gross lesions or discharge  Cervix: Absent  Uterus absent  Adnexa  Without masses or tenderness  Anus and perineum  normal   Rectovaginal  normal sphincter tone without palpated masses or tenderness             Hemoccult colonoscopy this year     Assessment/Plan:  48 y.o. female for annual exam doing well with the exception of dyspareunia and vaginal dryness. Minimal if any vasomotor symptoms. We discussed today Osphena to help  with her vaginal dryness and painful intercourse. We will prescribe 60 mg daily. Risk and benefits and pros and cons were discussed. The literature information was provided. Patient nonsmoker and no history of DVT. Pap smear was not done today in accordance to the Lungs. The following labs were ordered today: Fasting lipid profile, comprehensive metabolic panel, TSH, urinalysis and FSH. Make sure we will do a bone density study.  Note: This dictation was prepared with  Dragon/digital dictation along  withSmart phrase technology. Any transcriptional errors that result from this process are unintentional.   Ok Edwards MD, 11:19 AM 12/15/2013

## 2013-12-16 LAB — URINALYSIS W MICROSCOPIC + REFLEX CULTURE
BILIRUBIN URINE: NEGATIVE
CASTS: NONE SEEN
CRYSTALS: NONE SEEN
GLUCOSE, UA: NEGATIVE mg/dL
Hgb urine dipstick: NEGATIVE
KETONES UR: NEGATIVE mg/dL
NITRITE: NEGATIVE
PH: 5 (ref 5.0–8.0)
Protein, ur: NEGATIVE mg/dL
SPECIFIC GRAVITY, URINE: 1.023 (ref 1.005–1.030)
Urobilinogen, UA: 0.2 mg/dL (ref 0.0–1.0)

## 2013-12-16 LAB — FOLLICLE STIMULATING HORMONE: FSH: 159.4 m[IU]/mL — ABNORMAL HIGH

## 2013-12-16 LAB — TSH: TSH: 2.148 u[IU]/mL (ref 0.350–4.500)

## 2013-12-17 LAB — URINE CULTURE
Colony Count: NO GROWTH
ORGANISM ID, BACTERIA: NO GROWTH

## 2014-01-30 LAB — HM COLONOSCOPY

## 2014-02-09 ENCOUNTER — Other Ambulatory Visit: Payer: Self-pay | Admitting: Gynecology

## 2014-02-09 ENCOUNTER — Other Ambulatory Visit: Payer: Self-pay

## 2014-02-09 DIAGNOSIS — Z1231 Encounter for screening mammogram for malignant neoplasm of breast: Secondary | ICD-10-CM

## 2014-02-16 ENCOUNTER — Ambulatory Visit (HOSPITAL_COMMUNITY)
Admission: RE | Admit: 2014-02-16 | Discharge: 2014-02-16 | Disposition: A | Discharge status: Home / Self Care | Payer: BC Managed Care – PPO | Source: Ambulatory Visit | Attending: Gynecology | Admitting: Gynecology

## 2014-02-16 DIAGNOSIS — Z1231 Encounter for screening mammogram for malignant neoplasm of breast: Secondary | ICD-10-CM | POA: Insufficient documentation

## 2014-05-07 ENCOUNTER — Ambulatory Visit: Payer: BC Managed Care – PPO

## 2014-08-03 ENCOUNTER — Encounter: Payer: Self-pay | Admitting: Gynecology

## 2015-01-04 ENCOUNTER — Encounter: Payer: Self-pay | Admitting: Gynecology

## 2015-01-04 ENCOUNTER — Ambulatory Visit (INDEPENDENT_AMBULATORY_CARE_PROVIDER_SITE_OTHER): Payer: BLUE CROSS/BLUE SHIELD | Admitting: Gynecology

## 2015-01-04 VITALS — BP 114/70 | Ht 67.0 in | Wt 126.0 lb

## 2015-01-04 DIAGNOSIS — Z01419 Encounter for gynecological examination (general) (routine) without abnormal findings: Secondary | ICD-10-CM

## 2015-01-04 DIAGNOSIS — N952 Postmenopausal atrophic vaginitis: Secondary | ICD-10-CM

## 2015-01-04 DIAGNOSIS — Z1159 Encounter for screening for other viral diseases: Secondary | ICD-10-CM | POA: Diagnosis not present

## 2015-01-04 DIAGNOSIS — Z780 Asymptomatic menopausal state: Secondary | ICD-10-CM

## 2015-01-04 DIAGNOSIS — Z79890 Hormone replacement therapy (postmenopausal): Secondary | ICD-10-CM

## 2015-01-04 LAB — CBC WITH DIFFERENTIAL/PLATELET
BASOS ABS: 0.1 10*3/uL (ref 0.0–0.1)
BASOS PCT: 1 % (ref 0–1)
EOS ABS: 0.1 10*3/uL (ref 0.0–0.7)
EOS PCT: 2 % (ref 0–5)
HCT: 42 % (ref 36.0–46.0)
Hemoglobin: 13.9 g/dL (ref 12.0–15.0)
Lymphocytes Relative: 41 % (ref 12–46)
Lymphs Abs: 2.3 10*3/uL (ref 0.7–4.0)
MCH: 28.8 pg (ref 26.0–34.0)
MCHC: 33.1 g/dL (ref 30.0–36.0)
MCV: 87 fL (ref 78.0–100.0)
MONO ABS: 0.5 10*3/uL (ref 0.1–1.0)
MONOS PCT: 8 % (ref 3–12)
MPV: 10.6 fL (ref 8.6–12.4)
NEUTROS ABS: 2.7 10*3/uL (ref 1.7–7.7)
Neutrophils Relative %: 48 % (ref 43–77)
PLATELETS: 269 10*3/uL (ref 150–400)
RBC: 4.83 MIL/uL (ref 3.87–5.11)
RDW: 13.9 % (ref 11.5–15.5)
WBC: 5.7 10*3/uL (ref 4.0–10.5)

## 2015-01-04 LAB — HEPATITIS C ANTIBODY: HCV Ab: NEGATIVE

## 2015-01-04 LAB — COMPREHENSIVE METABOLIC PANEL
ALBUMIN: 4.7 g/dL (ref 3.5–5.2)
ALK PHOS: 71 U/L (ref 39–117)
ALT: 16 U/L (ref 0–35)
AST: 20 U/L (ref 0–37)
BUN: 15 mg/dL (ref 6–23)
CALCIUM: 9.6 mg/dL (ref 8.4–10.5)
CHLORIDE: 104 meq/L (ref 96–112)
CO2: 29 meq/L (ref 19–32)
Creat: 0.71 mg/dL (ref 0.50–1.10)
GLUCOSE: 78 mg/dL (ref 70–99)
POTASSIUM: 3.9 meq/L (ref 3.5–5.3)
Sodium: 141 mEq/L (ref 135–145)
TOTAL PROTEIN: 7.3 g/dL (ref 6.0–8.3)
Total Bilirubin: 0.6 mg/dL (ref 0.2–1.2)

## 2015-01-04 LAB — LIPID PANEL
CHOLESTEROL: 184 mg/dL (ref 0–200)
HDL: 75 mg/dL (ref >=46)
LDL Cholesterol: 94 mg/dL (ref 0–99)
TRIGLYCERIDES: 75 mg/dL (ref <150)
Total CHOL/HDL Ratio: 2.5 Ratio
VLDL: 15 mg/dL (ref 0–40)

## 2015-01-04 LAB — TSH: TSH: 1.95 u[IU]/mL (ref 0.350–4.500)

## 2015-01-04 MED ORDER — ESTRADIOL 10 MCG VA TABS: 1.0000 | ORAL_TABLET | VAGINAL | Status: DC

## 2015-01-04 NOTE — Patient Instructions (Addendum)
Bone Densitometry Bone densitometry is a special X-ray that measures your bone density and can be used to help predict your risk of bone fractures. This test is used to determine bone mineral content and density to diagnose osteoporosis. Osteoporosis is the loss of bone that may cause the bone to become weak. Osteoporosis commonly occurs in women entering menopause. However, it may be found in men and in people with other diseases. PREPARATION FOR TEST No preparation necessary. WHO SHOULD BE TESTED?  All women older than 65.  Postmenopausal women (50 to 65) with risk factors for osteoporosis.  People with a previous fracture caused by normal activities.  People with a small body frame (less than 127 poundsor a body mass index [BMI] of less than 21).  People who have a parent with a hip fracture or history of osteoporosis.  People who smoke.  People who have rheumatoid arthritis.  Anyone who engages in excessive alcohol use (more than 3 drinks most days).  Women who experience early menopause. WHEN SHOULD YOU BE RETESTED? Current guidelines suggest that you should wait at least 2 years before doing a bone density test again if your first test was normal.Recent studies indicated that women with normal bone density may be able to wait a few years before needing to repeat a bone density test. You should discuss this with your caregiver.  NORMAL FINDINGS   Normal: less than standard deviation below normal (greater than -1).  Osteopenia: 1 to 2.5 standard deviations below normal (-1 to -2.5).  Osteoporosis: greater than 2.5 standard deviations below normal (less than -2.5). Test results are reported as a "T score" and a "Z score."The T score is a number that compares your bone density with the bone density of healthy, young women.The Z score is a number that compares your bone density with the scores of women who are the same age, gender, and race.  Ranges for normal findings may vary  among different laboratories and hospitals. You should always check with your doctor after having lab work or other tests done to discuss the meaning of your test results and whether your values are considered within normal limits. MEANING OF TEST  Your caregiver will go over the test results with you and discuss the importance and meaning of your results, as well as treatment options and the need for additional tests if necessary. OBTAINING THE TEST RESULTS It is your responsibility to obtain your test results. Ask the lab or department performing the test when and how you will get your results. Document Released: 10/10/2004 Document Revised: 12/11/2011 Document Reviewed: 11/02/2010 ExitCare Patient Information 2015 [employer]. This information is not intended to replace advice given to you by your health care provider. Make sure you discuss any questions you have with your health care provider. Estradiol vaginal tablets What is this medicine? ESTRADIOL (es tra DYE ole) vaginal tablet is used to help relieve symptoms of vaginal irritation and dryness that occurs in some women during menopause. This medicine may be used for other purposes; ask your health care provider or pharmacist if you have questions. COMMON BRAND NAME(S): Vagifem What should I tell my health care provider before I take this medicine? They need to know if you have any of these conditions: -abnormal vaginal bleeding -blood vessel disease or blood clots -breast, cervical, endometrial, ovarian, liver, or uterine cancer -dementia -diabetes -gallbladder disease -heart disease or recent heart attack -high blood pressure -high cholesterol -high level of calcium in the blood -hysterectomy -kidney   disease -liver disease -migraine headaches -protein C deficiency -protein S deficiency -stroke -systemic lupus erythematosus (SLE) -tobacco smoker -an unusual or allergic reaction to estrogens, other hormones, medicines,  foods, dyes, or preservatives -pregnant or trying to get pregnant -breast-feeding How should I use this medicine? This medicine is only for use in the vagina. Do not take by mouth. Wash your hands before and after use. Read package directions carefully. Unwrap the pre-filled applicator package. Lie on your back, part and bend your knees. Gently insert the applicator tip high in the vagina and push the plunger to release the tablet into the vagina. Gently remove the applicator. Throw away the applicator after use. Do not use your medicine more often than directed. Finish the full course prescribed by your doctor or health care professional even if you think your condition is better. Do not stop using except on the advice of your doctor or health care professional. Talk to your pediatrician regarding the use of this medicine in children. A patient package insert for the product will be given with each prescription and refill. Read this sheet carefully each time. The sheet may change frequently. Overdosage: If you think you have taken too much of this medicine contact a poison control center or emergency room at once. NOTE: This medicine is only for you. Do not share this medicine with others. What if I miss a dose? If you miss a dose, take it as soon as you can. If it is almost time for your next dose, take only that dose. Do not take double or extra doses. What may interact with this medicine? Do not take this medicine with any of the following medications: -aromatase inhibitors like aminoglutethimide, anastrozole, exemestane, letrozole, testolactone This medicine may also interact with the following medications: -antibiotics used to treat tuberculosis like rifabutin, rifampin and rifapentene -raloxifene or tamoxifen -warfarin This list may not describe all possible interactions. Give your health care provider a list of all the medicines, herbs, non-prescription drugs, or dietary supplements you  use. Also tell them if you smoke, drink alcohol, or use illegal drugs. Some items may interact with your medicine. What should I watch for while using this medicine? Visit your health care professional for regular checks on your progress. You will need a regular breast and pelvic exam. You should also discuss the need for regular mammograms with your health care professional, and follow his or her guidelines. This medicine can make your body retain fluid, making your fingers, hands, or ankles swell. Your blood pressure can go up. Contact your doctor or health care professional if you feel you are retaining fluid. If you have any reason to think you are pregnant; stop taking this medicine at once and contact your doctor or health care professional. Tobacco smoking increases the risk of getting a blood clot or having a stroke, especially if you are more than 51 years old. You are strongly advised not to smoke. If you wear contact lenses and notice visual changes, or if the lenses begin to feel uncomfortable, consult your eye care specialist. If you are going to have elective surgery, you may need to stop taking this medicine beforehand. Consult your health care professional for advice prior to scheduling the surgery. What side effects may I notice from receiving this medicine? Side effects that you should report to your doctor or health care professional as soon as possible: -allergic reactions like skin rash, itching or hives, swelling of the face, lips, or tongue -breast tissue changes   or discharge -changes in vision -chest pain -confusion, trouble speaking or understanding -dark urine -general ill feeling or flu-like symptoms -light-colored stools -nausea, vomiting -pain, swelling, warmth in the leg -right upper belly pain -severe headaches -shortness of breath -sudden numbness or weakness of the face, arm or leg -trouble walking, dizziness, loss of balance or coordination -unusual vaginal  bleeding -yellowing of the eyes or skin Side effects that usually do not require medical attention (report to your doctor or health care professional if they continue or are bothersome): -hair loss -increased hunger or thirst -increased urination -symptoms of vaginal infection like itching, irritation or unusual discharge -unusually weak or tired This list may not describe all possible side effects. Call your doctor for medical advice about side effects. You may report side effects to FDA at 1-800-FDA-1088. Where should I keep my medicine? Keep out of the reach of children. Store at room temperature between 15 and 30 degrees C (59 and 86 degrees F). Throw away any unused medicine after the expiration date. NOTE: This sheet is a summary. It may not cover all possible information. If you have questions about this medicine, talk to your doctor, pharmacist, or health care provider.  2015, Elsevier/Gold Standard. (2010-12-21 09:08:58)  

## 2015-01-04 NOTE — Progress Notes (Signed)
Allison Neal 23-Dec-1965 [id number]   History:    49 y.o.  for annual gyn exam who last year as a result of her dyspareunia she was prescribed Osphena but it was too costly she had been using Vaseline when necessary. She would like to discuss those options. She is suffering from very little vasomotor symptoms per se. Patient had a transvaginal hysterectomy for menorrhagia back in 2011 and has done well. Her last colonoscopy was the age of 42 benign polyps was removed. Patient prior to her hysterectomy had no history of any abnormal Pap smears. Patient has not had a bone density study as of yet. She is currently not taking calcium vitamin D.  Past medical history,surgical history, family history and social history were all reviewed and documented in the EPIC chart.  Gynecologic History No LMP recorded. Patient has had a hysterectomy. Contraception: status post hysterectomy Last Pap: 2010. Results were: normal Last mammogram: 2015. Results were: Normal but dense  Obstetric History OB History  Gravida Para Term Preterm AB SAB TAB Ectopic Multiple Living  # Outcome Date GA Lbr Len/2nd Weight Sex Delivery Anes PTL Lv  3 Para           2 Para           1 Para                ROS: A ROS was performed and pertinent positives and negatives are included in the history.  GENERAL: No fevers or chills. HEENT: No change in vision, no earache, sore throat or sinus congestion. NECK: No pain or stiffness. CARDIOVASCULAR: No chest pain or pressure. No palpitations. PULMONARY: No shortness of breath, cough or wheeze. GASTROINTESTINAL: No abdominal pain, nausea, vomiting or diarrhea, melena or bright red blood per rectum. GENITOURINARY: No urinary frequency, urgency, hesitancy or dysuria. MUSCULOSKELETAL: No joint or muscle pain, no back pain, no recent trauma. DERMATOLOGIC: No rash, no itching, no lesions. ENDOCRINE: No polyuria, polydipsia, no heat or cold intolerance. No recent change  in weight. HEMATOLOGICAL: No anemia or easy bruising or bleeding. NEUROLOGIC: No headache, seizures, numbness, tingling or weakness. PSYCHIATRIC: No depression, no loss of interest in normal activity or change in sleep pattern.     Exam: chaperone present  BP 114/70 mmHg  Ht  (1.702 m)  Wt 126 lb (57.153 kg)  BMI 19.73 kg/m2  Body mass index is 19.73 kg/(m^2).  General appearance : Well developed well nourished female. No acute distress HEENT: Eyes: no retinal hemorrhage or exudates,  Neck supple, trachea midline, no carotid bruits, no thyroidmegaly Lungs: Clear to auscultation, no rhonchi or wheezes, or rib retractions  Heart: Regular rate and rhythm, no murmurs or gallops Breast:Examined in sitting and supine position were symmetrical in appearance, no palpable masses or tenderness,  no skin retraction, no nipple inversion, no nipple discharge, no skin discoloration, no axillary or supraclavicular lymphadenopathy Abdomen: no palpable masses or tenderness, no rebound or guarding Extremities: no edema or skin discoloration or tenderness  Pelvic:  Bartholin, Urethra, Skene Glands: Within normal limits             Vagina: No gross lesions or discharge, mild vaginal atrophy  Cervix: No gross lesions or discharge  Uterus  absent  Adnexa absent  Anus and perineum  normal   Rectovaginal  normal sphincter tone without palpated masses or tenderness  Hemoccult cards provided     Assessment/Plan:  49 y.o. female for annual exam with vaginal atrophy will be prescribed Vagifem 10 g to apply intravaginally twice a week. Risk benefits and pros and cons discussed. Literature information provided. Patient to have her screening blood work done today here to include the following: Fasting lipid profile, conference metabolic panel, TSH, CBC, and urinalysis. Pap smear was not done today no cords to the new guidelines. Patient's provided with fecal Hemoccult cards to submit for testing  at a later date. Patient was reminded him which she scheduled her mammogram in May to request a three-dimensional mammogram because her mammogram in 2015 demonstrated her breasts were dense. We discussed importance of calcium vitamin D and regular exercise for osteoporosis prevention. Bone density study will be scheduled.  New CDC guidelines is recommending patients be tested once in her lifetime for hepatitis C antibody who were born between [id number] through 1967. This was discussed with the patient today and has agreed to be tested today.   Ok EdwardsFERNANDEZ,Shelle Galdamez H MD, 8:31 AM 01/04/2015

## 2015-01-05 LAB — URINALYSIS W MICROSCOPIC + REFLEX CULTURE
Bacteria, UA: NONE SEEN
Bilirubin Urine: NEGATIVE
CRYSTALS: NONE SEEN
Casts: NONE SEEN
Glucose, UA: NEGATIVE mg/dL
HGB URINE DIPSTICK: NEGATIVE
Ketones, ur: NEGATIVE mg/dL
Leukocytes, UA: NEGATIVE
Nitrite: NEGATIVE
PH: 7 (ref 5.0–8.0)
PROTEIN: NEGATIVE mg/dL
Specific Gravity, Urine: 1.015 (ref 1.005–1.030)
UROBILINOGEN UA: 0.2 mg/dL (ref 0.0–1.0)

## 2015-01-08 ENCOUNTER — Telehealth: Payer: Self-pay | Admitting: *Deleted

## 2015-01-08 MED ORDER — NONFORMULARY OR COMPOUNDED ITEM: Status: DC

## 2015-01-08 NOTE — Telephone Encounter (Signed)
Please call in prescription at the compound pharmacy nearest her: Estradiol 0.02% Apply twice a week 3 months supply 4 refills

## 2015-01-08 NOTE — Telephone Encounter (Signed)
Pt said it was fine and Rx was called into [facility] per pt request.

## 2015-01-08 NOTE — Telephone Encounter (Signed)
PATIENT RX FOR VAGIFEM IS TOO EXPENSIVE WOULD LIKE AN ALTERNATE DRUG. PLEASE ADVISE

## 2015-02-24 ENCOUNTER — Other Ambulatory Visit: Payer: BLUE CROSS/BLUE SHIELD | Admitting: Anesthesiology

## 2015-02-24 DIAGNOSIS — Z1211 Encounter for screening for malignant neoplasm of colon: Secondary | ICD-10-CM | POA: Diagnosis not present

## 2015-03-24 ENCOUNTER — Other Ambulatory Visit: Payer: Self-pay | Admitting: Gynecology

## 2015-03-24 DIAGNOSIS — Z13820 Encounter for screening for osteoporosis: Secondary | ICD-10-CM

## 2015-04-13 ENCOUNTER — Encounter: Payer: Self-pay | Admitting: Gynecology

## 2015-04-13 ENCOUNTER — Ambulatory Visit (INDEPENDENT_AMBULATORY_CARE_PROVIDER_SITE_OTHER): Payer: BLUE CROSS/BLUE SHIELD

## 2015-04-13 ENCOUNTER — Other Ambulatory Visit: Payer: Self-pay | Admitting: Gynecology

## 2015-04-13 DIAGNOSIS — Z13820 Encounter for screening for osteoporosis: Secondary | ICD-10-CM

## 2015-04-13 DIAGNOSIS — M810 Age-related osteoporosis without current pathological fracture: Secondary | ICD-10-CM

## 2015-04-15 ENCOUNTER — Other Ambulatory Visit: Payer: Self-pay | Admitting: Gynecology

## 2015-04-15 DIAGNOSIS — M810 Age-related osteoporosis without current pathological fracture: Secondary | ICD-10-CM

## 2015-04-16 ENCOUNTER — Other Ambulatory Visit: Payer: BLUE CROSS/BLUE SHIELD

## 2015-04-16 ENCOUNTER — Other Ambulatory Visit: Payer: Self-pay | Admitting: Gynecology

## 2015-04-16 DIAGNOSIS — M810 Age-related osteoporosis without current pathological fracture: Secondary | ICD-10-CM

## 2015-04-16 DIAGNOSIS — Z1231 Encounter for screening mammogram for malignant neoplasm of breast: Secondary | ICD-10-CM

## 2015-04-17 LAB — VITAMIN D 25 HYDROXY (VIT D DEFICIENCY, FRACTURES): Vit D, 25-Hydroxy: 28 ng/mL — ABNORMAL LOW (ref 30–100)

## 2015-04-23 ENCOUNTER — Ambulatory Visit (HOSPITAL_COMMUNITY): Payer: Self-pay

## 2015-04-30 ENCOUNTER — Ambulatory Visit: Payer: BLUE CROSS/BLUE SHIELD | Admitting: Gynecology

## 2015-07-12 ENCOUNTER — Encounter: Payer: Self-pay | Admitting: Gynecology

## 2015-07-12 ENCOUNTER — Ambulatory Visit (INDEPENDENT_AMBULATORY_CARE_PROVIDER_SITE_OTHER): Payer: BLUE CROSS/BLUE SHIELD | Admitting: Gynecology

## 2015-07-12 VITALS — BP 116/68

## 2015-07-12 DIAGNOSIS — Z8262 Family history of osteoporosis: Secondary | ICD-10-CM

## 2015-07-12 DIAGNOSIS — N951 Menopausal and female climacteric states: Secondary | ICD-10-CM | POA: Diagnosis not present

## 2015-07-12 DIAGNOSIS — M810 Age-related osteoporosis without current pathological fracture: Secondary | ICD-10-CM | POA: Insufficient documentation

## 2015-07-12 DIAGNOSIS — Z79890 Hormone replacement therapy (postmenopausal): Secondary | ICD-10-CM | POA: Diagnosis not present

## 2015-07-12 DIAGNOSIS — N952 Postmenopausal atrophic vaginitis: Secondary | ICD-10-CM | POA: Diagnosis not present

## 2015-07-12 DIAGNOSIS — Z8269 Family history of other diseases of the musculoskeletal system and connective tissue: Secondary | ICD-10-CM | POA: Diagnosis not present

## 2015-07-12 DIAGNOSIS — Z8489 Family history of other specified conditions: Secondary | ICD-10-CM

## 2015-07-12 NOTE — Progress Notes (Signed)
   Patient is a 49 year old who presented to the office today to discuss her recent bone density study. This was patient's first bone density study. Patient with prior history of transvaginal hysterectomy in 2011 as a result of menorrhagia. Patient was seen for annual exam on April of this year and her blood work had indicated she had vitamin D deficiency and currently on vitamin D 2000 units daily.  Patient's risk factors for fracture are as follows: #1 thin  low body mass index #2 Caucasian #3 family history of osteoporosis #4 family history of fracture #5 currently on no hormone replacement therapy  Bone density demonstrated the following: Lowest T score was -2.7 in the osteoporotic range. The remainder of the region of interest all with decreased bone mineralization.  Assessment/plan: Postmenopausal patient with clinical evidence of osteoporosis with an extensive discussion on different treatment options. We discussed potential side effects from antiresorptive agents to include spontaneous subtrochanteric fractures as well as osteonecrosis of the jaw. Patient is status sometimes she experiences reflux so we are going to make an effort after approval from her insurance company to start her on Prolia 60 mg subcutaneous every 6 months. Literature information was provided on the medication as well as on osteoporosis. Patient with known history of vaginal atrophy was prescribed early this year vaginal estrogen to apply twice a week and has not picked up the prescription.  Greater than 50% of the time was spent in counseling coronary care for this patient with osteoporosis and risk for fracture.

## 2015-07-12 NOTE — Patient Instructions (Signed)
Alendronate effervescent oral tablets What is this medicine? ALENDRONATE (a LEN droe nate) slows calcium loss from bones. It helps to make healthy bone and to slow bone loss in people with osteoporosis. This medicine may be used for other purposes; ask your health care provider or pharmacist if you have questions. What should I tell my health care provider before I take this medicine? They need to know if you have any of these conditions: -esophagus, stomach, or intestine problems, like acid-reflux or GERD -dental disease -heart disease -high blood pressure -kidney disease -low blood calcium -low vitamin D -problems swallowing -problems sitting or standing for 30 minutes -an unusual or allergic reaction to alendronate, other medicines, foods, dyes, or preservatives -pregnant or trying to get pregnant -breast-feeding How should I use this medicine? You must take this medicine exactly as directed or you will lower the amount of medicine you absorb into your body or you may cause yourself harm. Take your dose by mouth first thing in the morning, after you are up for the day. Do not eat or drink anything before you take this medicine. Dissolve your medicine in a glass (4 fluid ounces) of plain, room temperature water. Do not take this tablet with any other drink. Wait at least 5 minutes after the medicine has dissolved and then stir for 10 seconds and drink. After taking this medicine, do not eat breakfast, drink, or take any medicines or vitamins for at least 30 minutes. Stand or sit up for at least 30 minutes after you take this medicine; do not lie down. Take this medicine on the same day every week. Do not take your medicine more often than directed. Talk to your pediatrician regarding the use of this medicine in children. Special care may be needed. Overdosage: If you think you have taken too much of this medicine contact a poison control center or emergency room at once. NOTE: This medicine  is only for you. Do not share this medicine with others. What if I miss a dose? If you miss a dose, take the dose on the morning after you remember. Then take your next dose on your regular day of the week. Never take 2 tablets on the same day. Do not take double or extra doses. What may interact with this medicine? -aluminum hydroxide -antacids -aspirin -calcium supplements -drugs for inflammation like ibuprofen, naproxen, and others -iron supplements -magnesium supplements -vitamins with minerals This list may not describe all possible interactions. Give your health care provider a list of all the medicines, herbs, non-prescription drugs, or dietary supplements you use. Also tell them if you smoke, drink alcohol, or use illegal drugs. Some items may interact with your medicine. What should I watch for while using this medicine? Visit your doctor or health care professional for regular checks ups. It may be some time before you see benefit from this medicine. Do not stop taking your medication except on your doctor's advice. Your doctor or health care professional may order blood tests and other tests to see how you are doing. You should make sure you get enough calcium and vitamin D while you are taking this medicine, unless your doctor tells you not to. Discuss the foods you eat and the vitamins you take with your health care professional. Some people who take this medicine have severe bone, joint, and/or muscle pain. This medicine may also increase your risk for a broken thigh bone. Tell your doctor right away if you have pain in your upper leg   or groin. Tell your doctor if you have any pain that does not go away or that gets worse. This medicine can make you more sensitive to the sun. If you get a rash while taking this medicine, sunlight may cause the rash to get worse. Keep out of the sun. If you cannot avoid being in the sun, wear protective clothing and use sunscreen. Do not use sun lamps  or tanning beds/booths. What side effects may I notice from receiving this medicine? Side effects that you should report to your doctor or health care professional as soon as possible: -allergic reactions like skin rash, itching or hives, swelling of the face, lips, or tongue -black or tarry stools -bone, muscle or joint pain -changes in vision -chest pain -heartburn or stomach pain -jaw pain, especially after dental work -pain or trouble when swallowing -redness, blistering, peeling or loosening of the skin, including inside the mouth Side effects that usually do not require medical attention (Report these to your doctor or health care professional if they continue or are bothersome.): -changes in taste -diarrhea or constipation -eye pain or itching -headache -nausea or vomiting -stomach gas or fullness This list may not describe all possible side effects. Call your doctor for medical advice about side effects. You may report side effects to FDA at 1-800-FDA-1088. Where should I keep my medicine? Keep out of the reach of children. Store between 20 and 25 degrees C (68 and 77 degrees F). Keep this medicine in the original container. Throw away any unused medicine after the expiration date. NOTE: This sheet is a summary. It may not cover all possible information. If you have questions about this medicine, talk to your doctor, pharmacist, or health care provider.    2016, Elsevier/Gold Standard. (2011-08-03 09:07:01) Denosumab injection What is this medicine? DENOSUMAB (den oh sue mab) slows bone breakdown. Prolia is used to treat osteoporosis in women after menopause and in men. Rivka Barbara is used to prevent bone fractures and other bone problems caused by cancer bone metastases. Rivka Barbara is also used to treat giant cell tumor of the bone. This medicine may be used for other purposes; ask your health care provider or pharmacist if you have questions. What should I tell my health care provider  before I take this medicine? They need to know if you have any of these conditions: -dental disease -eczema -infection or history of infections -kidney disease or on dialysis -low blood calcium or vitamin D -malabsorption syndrome -scheduled to have surgery or tooth extraction -taking medicine that contains denosumab -thyroid or parathyroid disease -an unusual reaction to denosumab, other medicines, foods, dyes, or preservatives -pregnant or trying to get pregnant -breast-feeding How should I use this medicine? This medicine is for injection under the skin. It is given by a health care professional in a hospital or clinic setting. If you are getting Prolia, a special MedGuide will be given to you by the pharmacist with each prescription and refill. Be sure to read this information carefully each time. For Prolia, talk to your pediatrician regarding the use of this medicine in children. Special care may be needed. For Rivka Barbara, talk to your pediatrician regarding the use of this medicine in children. While this drug may be prescribed for children as young as 13 years for selected conditions, precautions do apply. Overdosage: If you think you have taken too much of this medicine contact a poison control center or emergency room at once. NOTE: This medicine is only for you. Do not share  this medicine with others. What if I miss a dose? It is important not to miss your dose. Call your doctor or health care professional if you are unable to keep an appointment. What may interact with this medicine? Do not take this medicine with any of the following medications: -other medicines containing denosumab This medicine may also interact with the following medications: -medicines that suppress the immune system -medicines that treat cancer -steroid medicines like prednisone or cortisone This list may not describe all possible interactions. Give your health care provider a list of all the medicines,  herbs, non-prescription drugs, or dietary supplements you use. Also tell them if you smoke, drink alcohol, or use illegal drugs. Some items may interact with your medicine. What should I watch for while using this medicine? Visit your doctor or health care professional for regular checks on your progress. Your doctor or health care professional may order blood tests and other tests to see how you are doing. Call your doctor or health care professional if you get a cold or other infection while receiving this medicine. Do not treat yourself. This medicine may decrease your body's ability to fight infection. You should make sure you get enough calcium and vitamin D while you are taking this medicine, unless your doctor tells you not to. Discuss the foods you eat and the vitamins you take with your health care professional. See your dentist regularly. Brush and floss your teeth as directed. Before you have any dental work done, tell your dentist you are receiving this medicine. Do not become pregnant while taking this medicine or for 5 months after stopping it. Women should inform their doctor if they wish to become pregnant or think they might be pregnant. There is a potential for serious side effects to an unborn child. Talk to your health care professional or pharmacist for more information. What side effects may I notice from receiving this medicine? Side effects that you should report to your doctor or health care professional as soon as possible: -allergic reactions like skin rash, itching or hives, swelling of the face, lips, or tongue -breathing problems -chest pain -fast, irregular heartbeat -feeling faint or lightheaded, falls -fever, chills, or any other sign of infection -muscle spasms, tightening, or twitches -numbness or tingling -skin blisters or bumps, or is dry, peels, or red -slow healing or unexplained pain in the mouth or jaw -unusual bleeding or bruising Side effects that  usually do not require medical attention (Report these to your doctor or health care professional if they continue or are bothersome.): -muscle pain -stomach upset, gas This list may not describe all possible side effects. Call your doctor for medical advice about side effects. You may report side effects to FDA at 1-800-FDA-1088. Where should I keep my medicine? This medicine is only given in a clinic, doctor's office, or other health care setting and will not be stored at home. NOTE: This sheet is a summary. It may not cover all possible information. If you have questions about this medicine, talk to your doctor, pharmacist, or health care provider.    2016, Elsevier/Gold Standard. (2012-03-18 12:37:47) Osteoporosis Osteoporosis is the thinning and loss of density in the bones. Osteoporosis makes the bones more brittle, fragile, and likely to break (fracture). Over time, osteoporosis can cause the bones to become so weak that they fracture after a simple fall. The bones most likely to fracture are the bones in the hip, wrist, and spine. CAUSES  The exact cause is not  known. RISK FACTORS Anyone can develop osteoporosis. You may be at greater risk if you have a family history of the condition or have poor nutrition. You may also have a higher risk if you are:   Female.   65 years old or older.  A smoker.  Not physically active.   White or Asian.  Slender. SIGNS AND SYMPTOMS  A fracture might be the first sign of the disease, especially if it results from a fall or injury that would not usually cause a bone to break. Other signs and symptoms include:   Low back and neck pain.  Stooped posture.  Height loss. DIAGNOSIS  To make a diagnosis, your health care provider may:  Take a medical history.  Perform a physical exam.  Order tests, such as:  A bone mineral density test.  A dual-energy X-ray absorptiometry test. TREATMENT  The goal of osteoporosis treatment is to  strengthen your bones to reduce your risk of a fracture. Treatment may involve:  Making lifestyle changes, such as:  Eating a diet rich in calcium.  Doing weight-bearing and muscle-strengthening exercises.  Stopping tobacco use.  Limiting alcohol intake.  Taking medicine to slow the process of bone loss or to increase bone density.  Monitoring your levels of calcium and vitamin D. HOME CARE INSTRUCTIONS  Include calcium and vitamin D in your diet. Calcium is important for bone health, and vitamin D helps the body absorb calcium.  Perform weight-bearing and muscle-strengthening exercises as directed by your health care provider.  Do not use any tobacco products, including cigarettes, chewing tobacco, and electronic cigarettes. If you need help quitting, ask your health care provider.  Limit your alcohol intake.  Take medicines only as directed by your health care provider.  Keep all follow-up visits as directed by your health care provider. This is important.  Take precautions at home to lower your risk of falling, such as:  Keeping rooms well lit and clutter free.  Installing safety rails on stairs.  Using rubber mats in the bathroom and other areas that are often wet or slippery. SEEK IMMEDIATE MEDICAL CARE IF:  You fall or injure yourself.    This information is not intended to replace advice given to you by your health care provider. Make sure you discuss any questions you have with your health care provider.   Document Released: 06/28/2005 Document Revised: 10/09/2014 Document Reviewed: 02/26/2014 Elsevier Interactive Patient Education [employer]

## 2015-07-13 ENCOUNTER — Telehealth: Payer: Self-pay | Admitting: Gynecology

## 2015-07-13 NOTE — Telephone Encounter (Signed)
Allison Neal, please coordinate and for this patient to receive Prolia. She was just diagnosed with osteoporosis right femoral neck T score -2.7. Patient suffers from reflux. Hopefully insurance will cover for it. If not plan B will be to put her on alendronate 70 mg every weekly. She has mild vitamin D deficiency which she's on 2000 units daily. The rest her labs were normal. Patient waiting for your call. Her risk factors are as follows  #1 FEN low body mass index  #2 Caucasian  #3 family history of osteoporosis  #4 Family history of fractures  #5 no history of HRT   Above note from Dr Lily Peer.

## 2015-07-23 ENCOUNTER — Ambulatory Visit: Payer: Self-pay

## 2015-07-27 NOTE — Telephone Encounter (Signed)
Calcium 9.5 on 01/04/15  Pt has chosen not to start Prolia due to insurance coverage and would like to start oral medication for osteoporosis. She has a difficult time with remembering to take medication and wanted to know if a monthly oral medication was available? She would like to start oral meds .   Listed below are her insurance benefits for Prolia. Deductible $3500 (0 met), also 30 % co insurance with [employer] [phone] ($25 met). With OV a co pay $25. No OV no co pay. Explained Prolia benefit card to pt. Approx cost of Prolia $1000 which will go toward her deductible so that her cost for Prolia 2016 ($0), explained that this benefit will carry over to second injection in 01/2016 however the third injection in 08/2016 will be subject to her annual deductible. She stated that she is financially unable to afford Prolia even with the Prolia benefit card available to her.

## 2015-07-27 NOTE — Telephone Encounter (Signed)
See if insurance will cover Actonel 150 mg one PO q monthly # 1 with 11 refills if not then Fosomax 70 mg one PO q weekly #4 with 11 refills

## 2015-07-28 ENCOUNTER — Encounter: Payer: Self-pay | Admitting: Gynecology

## 2015-07-28 ENCOUNTER — Other Ambulatory Visit: Payer: Self-pay | Admitting: *Deleted

## 2015-07-28 MED ORDER — RISEDRONATE SODIUM 150 MG PO TABS: 150.0000 mg | ORAL_TABLET | ORAL | Status: DC

## 2015-07-29 NOTE — Telephone Encounter (Signed)
Phone call to pt. Allison DikeJennifer call in new Rx for Actonel. I instructed pt that she would take one pill a month, she needs to let me know what her decision will be regarding medication. Also has a RX for Fosamax on whole in the event the Actonel is also too expensive.

## 2015-08-02 ENCOUNTER — Telehealth: Payer: Self-pay | Admitting: *Deleted

## 2015-08-02 MED ORDER — ALENDRONATE SODIUM 70 MG PO TABS: 70.0000 mg | ORAL_TABLET | ORAL | Status: DC

## 2015-08-02 NOTE — Telephone Encounter (Signed)
See note from 08/02/15 from [place]. Pt decided on Fosamax

## 2015-08-02 NOTE — Telephone Encounter (Signed)
Pt called stating ACTONEL 150 MG was too expensive, would like Rx for Fosamax 70 mg once weekly. Per Dr.Fernandez note on 07/27/15, Rx will be sent. Patient aware.

## 2015-08-06 ENCOUNTER — Ambulatory Visit
Admission: RE | Admit: 2015-08-06 | Discharge: 2015-08-06 | Disposition: A | Discharge status: Home / Self Care | Payer: BLUE CROSS/BLUE SHIELD | Source: Ambulatory Visit | Attending: Gynecology | Admitting: Gynecology

## 2015-08-06 DIAGNOSIS — Z1231 Encounter for screening mammogram for malignant neoplasm of breast: Secondary | ICD-10-CM

## 2015-09-22 ENCOUNTER — Other Ambulatory Visit: Payer: Self-pay | Admitting: Anesthesiology

## 2015-09-22 DIAGNOSIS — E559 Vitamin D deficiency, unspecified: Secondary | ICD-10-CM

## 2016-01-10 ENCOUNTER — Encounter: Payer: BLUE CROSS/BLUE SHIELD | Admitting: Gynecology

## 2016-02-14 ENCOUNTER — Encounter: Payer: Self-pay | Admitting: Gynecology

## 2016-02-14 ENCOUNTER — Ambulatory Visit (INDEPENDENT_AMBULATORY_CARE_PROVIDER_SITE_OTHER): Payer: BLUE CROSS/BLUE SHIELD | Admitting: Gynecology

## 2016-02-14 VITALS — BP 104/68 | Ht 67.0 in | Wt 139.0 lb

## 2016-02-14 DIAGNOSIS — N952 Postmenopausal atrophic vaginitis: Secondary | ICD-10-CM | POA: Diagnosis not present

## 2016-02-14 DIAGNOSIS — Z01419 Encounter for gynecological examination (general) (routine) without abnormal findings: Secondary | ICD-10-CM

## 2016-02-14 DIAGNOSIS — M810 Age-related osteoporosis without current pathological fracture: Secondary | ICD-10-CM

## 2016-02-14 DIAGNOSIS — Z8639 Personal history of other endocrine, nutritional and metabolic disease: Secondary | ICD-10-CM | POA: Diagnosis not present

## 2016-02-14 LAB — CBC WITH DIFFERENTIAL/PLATELET
BASOS ABS: 59 {cells}/uL (ref 0–200)
BASOS PCT: 1 %
EOS ABS: 177 {cells}/uL (ref 15–500)
Eosinophils Relative: 3 %
HEMATOCRIT: 42 % (ref 35.0–45.0)
Hemoglobin: 13.5 g/dL (ref 11.7–15.5)
LYMPHS PCT: 45 %
Lymphs Abs: 2655 cells/uL (ref 850–3900)
MCH: 28.4 pg (ref 27.0–33.0)
MCHC: 32.1 g/dL (ref 32.0–36.0)
MCV: 88.2 fL (ref 80.0–100.0)
MONO ABS: 413 {cells}/uL (ref 200–950)
MONOS PCT: 7 %
MPV: 10.1 fL (ref 7.5–12.5)
NEUTROS PCT: 44 %
Neutro Abs: 2596 cells/uL (ref 1500–7800)
Platelets: 275 10*3/uL (ref 140–400)
RBC: 4.76 MIL/uL (ref 3.80–5.10)
RDW: 14.5 % (ref 11.0–15.0)
WBC: 5.9 10*3/uL (ref 3.8–10.8)

## 2016-02-14 LAB — LIPID PANEL
Cholesterol: 184 mg/dL (ref 125–200)
HDL: 69 mg/dL (ref >=46)
LDL CALC: 101 mg/dL (ref <130)
Total CHOL/HDL Ratio: 2.7 Ratio (ref <=5.0)
Triglycerides: 69 mg/dL (ref <150)
VLDL: 14 mg/dL (ref <30)

## 2016-02-14 LAB — TSH: TSH: 2.33 mIU/L

## 2016-02-14 LAB — COMPREHENSIVE METABOLIC PANEL
ALK PHOS: 46 U/L (ref 33–130)
ALT: 20 U/L (ref 6–29)
AST: 21 U/L (ref 10–35)
Albumin: 4.3 g/dL (ref 3.6–5.1)
BILIRUBIN TOTAL: 0.4 mg/dL (ref 0.2–1.2)
BUN: 13 mg/dL (ref 7–25)
CO2: 25 mmol/L (ref 20–31)
Calcium: 8.9 mg/dL (ref 8.6–10.4)
Chloride: 105 mmol/L (ref 98–110)
Creat: 0.63 mg/dL (ref 0.50–1.05)
GLUCOSE: 81 mg/dL (ref 65–99)
POTASSIUM: 4.1 mmol/L (ref 3.5–5.3)
Sodium: 142 mmol/L (ref 135–146)
Total Protein: 6.8 g/dL (ref 6.1–8.1)

## 2016-02-14 MED ORDER — ESTRADIOL 10 MCG VA TABS: 1.0000 | ORAL_TABLET | VAGINAL | Status: DC

## 2016-02-14 MED ORDER — ALENDRONATE SODIUM 70 MG PO TABS: 70.0000 mg | ORAL_TABLET | ORAL | Status: DC

## 2016-02-14 NOTE — Progress Notes (Signed)
Allison Neal 18-Sep-1966 [id number]   History:    50 y.o.  for annual gyn exam who is asymptomatic. Patient had a bone density study done last year which had demonstrated the following:  Lowest T score was -2.7 in the osteoporotic range. The remainder of the region of interest all with decreased bone mineralization. As a result of this she was started on Fosamax 70 mg every weekly which she has been tolerating well. She's also been diagnosed with vitamin D deficiency and had been started on vitamin D 50,000 units every weekly for 12 weeks which she did but did not follow-up for vitamin D level check. She is taking her vitamin D 2000 units daily along with calcium. Patient had a colonoscopy in 2015 she had a benign polyp removed and she's and the impression that she's on a 5 year recall innominate 10 year recall.  Patient has suffer from vaginal atrophy but does not like to vaginal estrogen cream would like to try the Vagifem. She has a prior history of vaginal hysterectomy several years ago. Patient prior to her hysterectomy did not have any abnormal Pap smears. She is here fasting today for her blood work as well.  Past medical history,surgical history, family history and social history were all reviewed and documented in the EPIC chart.  Gynecologic History No LMP recorded. Patient has had a hysterectomy. Contraception: status post hysterectomy Last Pap: 2010. Results were: normal Last mammogram: 2016. Results were: Normal  Obstetric History OB History  Gravida Para Term Preterm AB SAB TAB Ectopic Multiple Living  # Outcome Date GA Lbr Len/2nd Weight Sex Delivery Anes PTL Lv  3 Para           2 Para           1 Para                ROS: A ROS was performed and pertinent positives and negatives are included in the history.  GENERAL: No fevers or chills. HEENT: No change in vision, no earache, sore throat or sinus congestion. NECK: No pain or stiffness.  CARDIOVASCULAR: No chest pain or pressure. No palpitations. PULMONARY: No shortness of breath, cough or wheeze. GASTROINTESTINAL: No abdominal pain, nausea, vomiting or diarrhea, melena or bright red blood per rectum. GENITOURINARY: No urinary frequency, urgency, hesitancy or dysuria. MUSCULOSKELETAL: No joint or muscle pain, no back pain, no recent trauma. DERMATOLOGIC: No rash, no itching, no lesions. ENDOCRINE: No polyuria, polydipsia, no heat or cold intolerance. No recent change in weight. HEMATOLOGICAL: No anemia or easy bruising or bleeding. NEUROLOGIC: No headache, seizures, numbness, tingling or weakness. PSYCHIATRIC: No depression, no loss of interest in normal activity or change in sleep pattern.     Exam: chaperone present  BP 104/68 mmHg  Ht  (1.702 m)  Wt 139 lb (63.05 kg)  BMI 21.77 kg/m2  Body mass index is 21.77 kg/(m^2).  General appearance : Well developed well nourished female. No acute distress HEENT: Eyes: no retinal hemorrhage or exudates,  Neck supple, trachea midline, no carotid bruits, no thyroidmegaly Lungs: Clear to auscultation, no rhonchi or wheezes, or rib retractions  Heart: Regular rate and rhythm, no murmurs or gallops Breast:Examined in sitting and supine position were symmetrical in appearance, no palpable masses or tenderness,  no skin retraction, no nipple inversion, no nipple discharge, no skin discoloration, no axillary or supraclavicular lymphadenopathy Abdomen: no  palpable masses or tenderness, no rebound or guarding Extremities: no edema or skin discoloration or tenderness  Pelvic:  Bartholin, Urethra, Skene Glands: Within normal limits             Vagina: No gross lesions or discharge  Cervix: Absent  Uterus  absent  Adnexa  Without masses or tenderness  Anus and perineum  normal   Rectovaginal  normal sphincter tone without palpated masses or tenderness             Hemoccult PCP provides     Assessment/Plan:  50 y.o. female for annual  exam will have the following blood work drawn today: CBC, comprehensive metabolic panel, fasting lipid profile, TSH, and urinalysis. Pap smear not indicated. Because of her vitamin D deficiency history will check her vitamin D level as well. She was encouraged to do weightbearing exercises 3-4 times a week and to take her vitamin D 2000 units daily. She is encouraged also to do her monthly self breast examination. A prescription for Vagifem 10 g to apply intravaginally twice a week for vaginal atrophy was provided. Risk benefits and pros and cons discussed. She will need a bone density study next year.    Ok EdwardsFERNANDEZ,Margarie Mcguirt H MD, 9:21 AM 02/14/2016

## 2016-02-14 NOTE — Patient Instructions (Signed)
Estradiol vaginal tablets What is this medicine? ESTRADIOL (es tra DYE ole) vaginal tablet is used to help relieve symptoms of vaginal irritation and dryness that occurs in some women during menopause. This medicine may be used for other purposes; ask your health care provider or pharmacist if you have questions. What should I tell my health care provider before I take this medicine? They need to know if you have any of these conditions: -abnormal vaginal bleeding -blood vessel disease or blood clots -breast, cervical, endometrial, ovarian, liver, or uterine cancer -dementia -diabetes -gallbladder disease -heart disease or recent heart attack -high blood pressure -high cholesterol -high level of calcium in the blood -hysterectomy -kidney disease -liver disease -migraine headaches -protein C deficiency -protein S deficiency -stroke -systemic lupus erythematosus (SLE) -tobacco smoker -an unusual or allergic reaction to estrogens, other hormones, medicines, foods, dyes, or preservatives -pregnant or trying to get pregnant -breast-feeding How should I use this medicine? This medicine is only for use in the vagina. Do not take by mouth. Wash and dry your hands before and after use. Read package directions carefully. Unwrap the applicator package. Be sure to use a new applicator for each dose. Use at the same time each day. If the tablet has fallen out of the applicator, but is still in the package, carefully place it back into the applicator. If the tablet has fallen out of the package, that applicator should be thrown out and you should use a new applicator containing a new tablet. Lie on your back, part and bend your knees. Gently insert the applicator as far as comfortably possible into the vagina. Then, gently press the plunger until the plunger is fully depressed. This will release the tablet into the vagina. Gently remove the applicator. Throw away the applicator after use. Do not use  your medicine more often than directed. Do not stop using except on the advice of your doctor or health care professional. Talk to your pediatrician regarding the use of this medicine in children. This medicine is not approved for use in children. A patient package insert for the product will be given with each prescription and refill. Read this sheet carefully each time. The sheet may change frequently. Overdosage: If you think you have taken too much of this medicine contact a poison control center or emergency room at once. NOTE: This medicine is only for you. Do not share this medicine with others. What if I miss a dose? If you miss a dose, take it as soon as you can. If it is almost time for your next dose, take only that dose. Do not take double or extra doses. What may interact with this medicine? Do not take this medicine with any of the following medications: -aromatase inhibitors like aminoglutethimide, anastrozole, exemestane, letrozole, testolactone This medicine may also interact with the following medications: -antibiotics used to treat tuberculosis like rifabutin, rifampin and rifapentene -raloxifene or tamoxifen -warfarin This list may not describe all possible interactions. Give your health care provider a list of all the medicines, herbs, non-prescription drugs, or dietary supplements you use. Also tell them if you smoke, drink alcohol, or use illegal drugs. Some items may interact with your medicine. What should I watch for while using this medicine? Visit your health care professional for regular checks on your progress. You will need a regular breast and pelvic exam. You should also discuss the need for regular mammograms with your health care professional, and follow his or her guidelines. This medicine can make   your body retain fluid, making your fingers, hands, or ankles swell. Your blood pressure can go up. Contact your doctor or health care professional if you feel you are  retaining fluid. If you have any reason to think you are pregnant; stop taking this medicine at once and contact your doctor or health care professional. Tobacco smoking increases the risk of getting a blood clot or having a stroke, especially if you are more than 52 years old. You are strongly advised not to smoke. If you wear contact lenses and notice visual changes, or if the lenses begin to feel uncomfortable, consult your eye care specialist. If you are going to have elective surgery, you may need to stop taking this medicine beforehand. Consult your health care professional for advice prior to scheduling the surgery. What side effects may I notice from receiving this medicine? Side effects that you should report to your doctor or health care professional as soon as possible: -allergic reactions like skin rash, itching or hives, swelling of the face, lips, or tongue -breast tissue changes or discharge -changes in vision -chest pain -confusion, trouble speaking or understanding -dark urine -general ill feeling or flu-like symptoms -light-colored stools -nausea, vomiting -pain, swelling, warmth in the leg -right upper belly pain -severe headaches -shortness of breath -sudden numbness or weakness of the face, arm or leg -trouble walking, dizziness, loss of balance or coordination -unusual vaginal bleeding -yellowing of the eyes or skin Side effects that usually do not require medical attention (report to your doctor or health care professional if they continue or are bothersome): -hair loss -increased hunger or thirst -increased urination -symptoms of vaginal infection like itching, irritation or unusual discharge -unusually weak or tired This list may not describe all possible side effects. Call your doctor for medical advice about side effects. You may report side effects to FDA at 1-800-FDA-1088. Where should I keep my medicine? Keep out of the reach of children. Store at room  temperature between 15 and 30 degrees C (59 and 86 degrees F). Throw away any unused medicine after the expiration date. NOTE: This sheet is a summary. It may not cover all possible information. If you have questions about this medicine, talk to your doctor, pharmacist, or health care provider.    2016, Elsevier/[employer]. (2014-09-02 09:22:51)  

## 2016-02-15 ENCOUNTER — Other Ambulatory Visit: Payer: Self-pay | Admitting: Gynecology

## 2016-02-15 ENCOUNTER — Encounter: Payer: Self-pay | Admitting: Gynecology

## 2016-02-15 DIAGNOSIS — E559 Vitamin D deficiency, unspecified: Secondary | ICD-10-CM

## 2016-02-15 LAB — URINALYSIS W MICROSCOPIC + REFLEX CULTURE
BACTERIA UA: NONE SEEN [HPF]
BILIRUBIN URINE: NEGATIVE
Casts: NONE SEEN [LPF]
Crystals: NONE SEEN [HPF]
GLUCOSE, UA: NEGATIVE
Hgb urine dipstick: NEGATIVE
KETONES UR: NEGATIVE
LEUKOCYTES UA: NEGATIVE
Nitrite: NEGATIVE
Protein, ur: NEGATIVE
RBC / HPF: NONE SEEN RBC/HPF (ref <=2)
SPECIFIC GRAVITY, URINE: 1.018 (ref 1.001–1.035)
WBC UA: NONE SEEN WBC/HPF (ref <=5)
Yeast: NONE SEEN [HPF]
pH: 7.5 (ref 5.0–8.0)

## 2016-02-15 LAB — VITAMIN D 25 HYDROXY (VIT D DEFICIENCY, FRACTURES): Vit D, 25-Hydroxy: 29 ng/mL — ABNORMAL LOW (ref 30–100)

## 2016-02-15 MED ORDER — VITAMIN D (ERGOCALCIFEROL) 1.25 MG (50000 UNIT) PO CAPS: 50000.0000 [IU] | ORAL_CAPSULE | ORAL | Status: DC

## 2016-05-15 ENCOUNTER — Other Ambulatory Visit: Payer: BLUE CROSS/BLUE SHIELD

## 2016-05-15 DIAGNOSIS — E559 Vitamin D deficiency, unspecified: Secondary | ICD-10-CM

## 2016-05-16 LAB — VITAMIN D 25 HYDROXY (VIT D DEFICIENCY, FRACTURES): Vit D, 25-Hydroxy: 42 ng/mL (ref 30–100)

## 2016-08-03 ENCOUNTER — Other Ambulatory Visit: Payer: Self-pay | Admitting: Gynecology

## 2016-08-03 DIAGNOSIS — Z1231 Encounter for screening mammogram for malignant neoplasm of breast: Secondary | ICD-10-CM

## 2016-09-04 ENCOUNTER — Ambulatory Visit
Admission: RE | Admit: 2016-09-04 | Discharge: 2016-09-04 | Disposition: A | Discharge status: Home / Self Care | Payer: BLUE CROSS/BLUE SHIELD | Source: Ambulatory Visit | Attending: Gynecology | Admitting: Gynecology

## 2016-09-04 DIAGNOSIS — Z1231 Encounter for screening mammogram for malignant neoplasm of breast: Secondary | ICD-10-CM

## 2016-09-06 ENCOUNTER — Other Ambulatory Visit: Payer: Self-pay | Admitting: Gynecology

## 2016-09-06 DIAGNOSIS — R928 Other abnormal and inconclusive findings on diagnostic imaging of breast: Secondary | ICD-10-CM

## 2016-09-12 ENCOUNTER — Ambulatory Visit
Admission: RE | Admit: 2016-09-12 | Discharge: 2016-09-12 | Disposition: A | Discharge status: Home / Self Care | Payer: BLUE CROSS/BLUE SHIELD | Source: Ambulatory Visit | Attending: Gynecology | Admitting: Gynecology

## 2016-09-12 DIAGNOSIS — R928 Other abnormal and inconclusive findings on diagnostic imaging of breast: Secondary | ICD-10-CM

## 2017-02-14 ENCOUNTER — Encounter: Payer: BLUE CROSS/BLUE SHIELD | Admitting: Gynecology

## 2017-02-14 ENCOUNTER — Encounter: Payer: Self-pay | Admitting: Gynecology

## 2017-02-19 ENCOUNTER — Encounter: Payer: Self-pay | Admitting: Gynecology

## 2017-02-19 ENCOUNTER — Ambulatory Visit (INDEPENDENT_AMBULATORY_CARE_PROVIDER_SITE_OTHER): Payer: BLUE CROSS/BLUE SHIELD | Admitting: Gynecology

## 2017-02-19 VITALS — BP 110/66 | Ht 67.0 in | Wt 140.0 lb

## 2017-02-19 DIAGNOSIS — Z01419 Encounter for gynecological examination (general) (routine) without abnormal findings: Secondary | ICD-10-CM

## 2017-02-19 DIAGNOSIS — E559 Vitamin D deficiency, unspecified: Secondary | ICD-10-CM | POA: Diagnosis not present

## 2017-02-19 DIAGNOSIS — Z79890 Hormone replacement therapy (postmenopausal): Secondary | ICD-10-CM

## 2017-02-19 DIAGNOSIS — M810 Age-related osteoporosis without current pathological fracture: Secondary | ICD-10-CM

## 2017-02-19 LAB — CBC WITH DIFFERENTIAL/PLATELET
BASOS PCT: 1 %
Basophils Absolute: 48 cells/uL (ref 0–200)
EOS PCT: 3 %
Eosinophils Absolute: 144 cells/uL (ref 15–500)
HEMATOCRIT: 42.7 % (ref 35.0–45.0)
HEMOGLOBIN: 13.7 g/dL (ref 11.7–15.5)
LYMPHS ABS: 2016 {cells}/uL (ref 850–3900)
Lymphocytes Relative: 42 %
MCH: 28.3 pg (ref 27.0–33.0)
MCHC: 32.1 g/dL (ref 32.0–36.0)
MCV: 88.2 fL (ref 80.0–100.0)
MONO ABS: 432 {cells}/uL (ref 200–950)
MPV: 10.9 fL (ref 7.5–12.5)
Monocytes Relative: 9 %
NEUTROS ABS: 2160 {cells}/uL (ref 1500–7800)
NEUTROS PCT: 45 %
PLATELETS: 284 10*3/uL (ref 140–400)
RBC: 4.84 MIL/uL (ref 3.80–5.10)
RDW: 14.4 % (ref 11.0–15.0)
WBC: 4.8 10*3/uL (ref 3.8–10.8)

## 2017-02-19 LAB — COMPREHENSIVE METABOLIC PANEL
ALBUMIN: 4.6 g/dL (ref 3.6–5.1)
ALT: 26 U/L (ref 6–29)
AST: 30 U/L (ref 10–35)
Alkaline Phosphatase: 54 U/L (ref 33–130)
BILIRUBIN TOTAL: 0.5 mg/dL (ref 0.2–1.2)
BUN: 14 mg/dL (ref 7–25)
CO2: 24 mmol/L (ref 20–31)
CREATININE: 0.79 mg/dL (ref 0.50–1.05)
Calcium: 9.3 mg/dL (ref 8.6–10.4)
Chloride: 106 mmol/L (ref 98–110)
Glucose, Bld: 71 mg/dL (ref 65–99)
Potassium: 4 mmol/L (ref 3.5–5.3)
SODIUM: 141 mmol/L (ref 135–146)
Total Protein: 7.4 g/dL (ref 6.1–8.1)

## 2017-02-19 LAB — LIPID PANEL
Cholesterol: 171 mg/dL (ref <200)
HDL: 73 mg/dL (ref >50)
LDL CALC: 85 mg/dL (ref <100)
TRIGLYCERIDES: 66 mg/dL (ref <150)
Total CHOL/HDL Ratio: 2.3 Ratio (ref <5.0)
VLDL: 13 mg/dL (ref <30)

## 2017-02-19 MED ORDER — ESTRADIOL 10 MCG VA TABS: 1.0000 | ORAL_TABLET | VAGINAL | 11 refills | Status: DC

## 2017-02-19 MED ORDER — ALENDRONATE SODIUM 70 MG PO TABS: 70.0000 mg | ORAL_TABLET | ORAL | 11 refills | Status: DC

## 2017-02-19 NOTE — Patient Instructions (Signed)
Vitamin D Deficiency Vitamin D deficiency is when your body does not have enough vitamin D. Vitamin D is important to your body for many reasons:  It helps the body to absorb two important minerals, called calcium and phosphorus.  It plays a role in bone health.  It may help to prevent some diseases, such as diabetes and multiple sclerosis.  It plays a role in muscle function, including heart function. You can get vitamin D by:  Eating foods that naturally contain vitamin D.  Eating or drinking milk or other dairy products that have vitamin D added to them.  Taking a vitamin D supplement or a multivitamin supplement that contains vitamin D.  Being in the sun. Your body naturally makes vitamin D when your skin is exposed to sunlight. Your body changes the sunlight into a form of the vitamin that the body can use. If vitamin D deficiency is severe, it can cause a condition in which your bones become soft. In adults, this condition is called osteomalacia. In children, this condition is called rickets. What are the causes? Vitamin D deficiency may be caused by:  Not eating enough foods that contain vitamin D.  Not getting enough sun exposure.  Having certain digestive system diseases that make it difficult for your body to absorb vitamin D. These diseases include Crohn disease, chronic pancreatitis, and cystic fibrosis.  Having a surgery in which a part of the stomach or a part of the small intestine is removed.  Being obese.  Having chronic kidney disease or liver disease. What increases the risk? This condition is more likely to develop in:  Older people.  People who do not spend much time outdoors.  People who live in a long-term care facility.  People who have had broken bones.  People with weak or thin bones (osteoporosis).  People who have a disease or condition that changes how the body absorbs vitamin D.  People who have dark skin.  People who take certain  medicines, such as steroid medicines or certain seizure medicines.  People who are overweight or obese. What are the signs or symptoms? In mild cases of vitamin D deficiency, there may not be any symptoms. If the condition is severe, symptoms may include:  Bone pain.  Muscle pain.  Falling often.  Broken bones caused by a minor injury. How is this diagnosed? This condition is usually diagnosed with a blood test. How is this treated? Treatment for this condition may depend on what caused the condition. Treatment options include:  Taking vitamin D supplements.  Taking a calcium supplement. Your health care provider will suggest what dose is best for you. Follow these instructions at home:  Take medicines and supplements only as told by your health care provider.  Eat foods that contain vitamin D. Choices include:  Fortified dairy products, cereals, or juices. Fortified means that vitamin D has been added to the food. Check the label on the package to be sure.  Fatty fish, such as salmon or trout.  Eggs.  Oysters.  Do not use a tanning bed.  Maintain a healthy weight. Lose weight, if needed.  Keep all follow-up visits as told by your health care provider. This is important. Contact a health care provider if:  Your symptoms do not go away.  You feel like throwing up (nausea) or you throw up (vomit).  You have fewer bowel movements than usual or it is difficult for you to have a bowel movement (constipation). This information is   not intended to replace advice given to you by your health care provider. Make sure you discuss any questions you have with your health care provider. Document Released: 12/11/2011 Document Revised: 03/01/2016 Document Reviewed: 02/03/2015 Elsevier Interactive Patient Education  2017 [employer]  

## 2017-02-19 NOTE — Progress Notes (Signed)
Allison Neal [birth year] [id number]   History:    51 y.o.  for annual gyn exam with no complaints today. Patient with history of osteoporosis was started on Fosamax 70 mg every weekly and 2016. Patient also with history vitamin D deficiency for which she was treated for 3 months and return to the office for follow-up vitamin D level which was normal but she is not taking any vitamin D currently. Also for vaginal atrophy she had been prescribed Vagifem 10 g to apply twice a week. Patient's bone density study in 2016 demonstrated the following:  Lowest T score was -2.7 in the osteoporotic range. The remainder of the region of interest all with decreased bone mineralization  Patient had several risk factors that had been discussed before initiating Fosamax as follows: #1 thin  low body mass index #2 Caucasian #3 family history of osteoporosis #4 family history of fracture #5 at that time had not been on any hormone replacement therapy she is currently on Vagifem 10 g twice a week for vaginal atrophy.  Patient with colonoscopy in 2015 demonstrated benign colon polyps. She is on a 5 year recall. Patient prior to and after her hysterectomy has never had any history of any abnormal Pap smears. Patient with prior history of vaginal hysterectomy for dysfunctional uterine bleeding.   Past medical history,surgical history, family history and social history were all reviewed and documented in the EPIC chart.  Gynecologic History No LMP recorded. Patient has had a hysterectomy. Contraception: status post hysterectomy Last Pap: 2010. Results were: normal Last mammogram: 2017. Results were: normal  Obstetric History OB History  Gravida Para Term Preterm AB Living  3 3       3   SAB TAB Ectopic Multiple Live Births               # Outcome Date GA Lbr Len/2nd Weight Sex Delivery Anes PTL Lv  3 Para           2 Para           1 Para                ROS: A ROS was performed and pertinent  positives and negatives are included in the history.  GENERAL: No fevers or chills. HEENT: No change in vision, no earache, sore throat or sinus congestion. NECK: No pain or stiffness. CARDIOVASCULAR: No chest pain or pressure. No palpitations. PULMONARY: No shortness of breath, cough or wheeze. GASTROINTESTINAL: No abdominal pain, nausea, vomiting or diarrhea, melena or bright red blood per rectum. GENITOURINARY: No urinary frequency, urgency, hesitancy or dysuria. MUSCULOSKELETAL: No joint or muscle pain, no back pain, no recent trauma. DERMATOLOGIC: No rash, no itching, no lesions. ENDOCRINE: No polyuria, polydipsia, no heat or cold intolerance. No recent change in weight. HEMATOLOGICAL: No anemia or easy bruising or bleeding. NEUROLOGIC: No headache, seizures, numbness, tingling or weakness. PSYCHIATRIC: No depression, no loss of interest in normal activity or change in sleep pattern.     Exam: chaperone present  BP 110/66   Ht 5\' 7"  (1.702 m)   Wt 140 lb (63.5 kg)   BMI 21.93 kg/m   Body mass index is 21.93 kg/m.  General appearance : Well developed well nourished female. No acute distress HEENT: Eyes: no retinal hemorrhage or exudates,  Neck supple, trachea midline, no carotid bruits, no thyroidmegaly Lungs: Clear to auscultation, no rhonchi or wheezes, or rib retractions  Heart: Regular rate and rhythm, no murmurs or gallops Breast:Examined  in sitting and supine position were symmetrical in appearance, no palpable masses or tenderness,  no skin retraction, no nipple inversion, no nipple discharge, no skin discoloration, no axillary or supraclavicular lymphadenopathy Abdomen: no palpable masses or tenderness, no rebound or guarding Extremities: no edema or skin discoloration or tenderness  Pelvic:  Bartholin, Urethra, Skene Glands: Within normal limits             Vagina: No gross lesions or discharge  Cervix:Absent  Uterus  absent  Adnexa  Without masses or tenderness  Anus and  perineum  normal   Rectovaginal  normal sphincter tone without palpated masses or tenderness             Hemoccult cards provided     Assessment/Plan:  51 y.o. female for annual exam with history of osteoporosis diagnosed in 2016 we discussed continuing the medication for 7 years. She is due for bone density study bleeding July. We discussed once again the importance of calcium vitamin D and weightbearing exercises. We're going to check her vitamin D level today along with the following screening fasting blood work: Fasting lipid profile, comprehensive metabolic panel, and CBC. Pap smear not indicated. She will schedule her mammogram for December this year. Prescription refill for Fosamax and Vagifem was provided.  Ok Edwards MD, 8:45 AM 02/19/2017

## 2017-02-20 LAB — VITAMIN D 25 HYDROXY (VIT D DEFICIENCY, FRACTURES): VIT D 25 HYDROXY: 29 ng/mL — AB (ref 30–100)

## 2017-02-21 ENCOUNTER — Other Ambulatory Visit: Payer: Self-pay | Admitting: Gynecology

## 2017-02-21 DIAGNOSIS — E559 Vitamin D deficiency, unspecified: Secondary | ICD-10-CM

## 2017-02-21 MED ORDER — VITAMIN D (ERGOCALCIFEROL) 1.25 MG (50000 UNIT) PO CAPS: 50000.0000 [IU] | ORAL_CAPSULE | ORAL | 0 refills | Status: DC

## 2017-04-03 DIAGNOSIS — Z0289 Encounter for other administrative examinations: Secondary | ICD-10-CM

## 2017-04-24 ENCOUNTER — Ambulatory Visit (INDEPENDENT_AMBULATORY_CARE_PROVIDER_SITE_OTHER): Payer: BLUE CROSS/BLUE SHIELD

## 2017-04-24 DIAGNOSIS — Z79890 Hormone replacement therapy (postmenopausal): Secondary | ICD-10-CM

## 2017-04-24 DIAGNOSIS — M810 Age-related osteoporosis without current pathological fracture: Secondary | ICD-10-CM

## 2017-04-24 DIAGNOSIS — E559 Vitamin D deficiency, unspecified: Secondary | ICD-10-CM | POA: Diagnosis not present

## 2017-06-01 ENCOUNTER — Other Ambulatory Visit: Payer: BLUE CROSS/BLUE SHIELD

## 2017-06-01 DIAGNOSIS — E559 Vitamin D deficiency, unspecified: Secondary | ICD-10-CM

## 2017-06-02 LAB — VITAMIN D 25 HYDROXY (VIT D DEFICIENCY, FRACTURES): VIT D 25 HYDROXY: 38 ng/mL (ref 30–100)

## 2017-10-11 ENCOUNTER — Other Ambulatory Visit: Payer: Self-pay | Admitting: Obstetrics & Gynecology

## 2017-10-11 DIAGNOSIS — Z1231 Encounter for screening mammogram for malignant neoplasm of breast: Secondary | ICD-10-CM

## 2017-10-26 ENCOUNTER — Ambulatory Visit
Admission: RE | Admit: 2017-10-26 | Discharge: 2017-10-26 | Disposition: A | Discharge status: Home / Self Care | Payer: BLUE CROSS/BLUE SHIELD | Source: Ambulatory Visit | Attending: Obstetrics & Gynecology | Admitting: Obstetrics & Gynecology

## 2017-10-26 DIAGNOSIS — Z1231 Encounter for screening mammogram for malignant neoplasm of breast: Secondary | ICD-10-CM

## 2017-11-02 ENCOUNTER — Ambulatory Visit: Payer: BLUE CROSS/BLUE SHIELD

## 2018-02-20 ENCOUNTER — Encounter (INDEPENDENT_AMBULATORY_CARE_PROVIDER_SITE_OTHER): Payer: Self-pay

## 2018-02-20 ENCOUNTER — Encounter: Payer: Self-pay | Admitting: Women's Health

## 2018-02-20 ENCOUNTER — Ambulatory Visit (INDEPENDENT_AMBULATORY_CARE_PROVIDER_SITE_OTHER): Payer: BLUE CROSS/BLUE SHIELD | Admitting: Women's Health

## 2018-02-20 VITALS — BP 114/62 | Ht 67.0 in | Wt 144.0 lb

## 2018-02-20 DIAGNOSIS — Z13220 Encounter for screening for lipoid disorders: Secondary | ICD-10-CM

## 2018-02-20 DIAGNOSIS — Z01419 Encounter for gynecological examination (general) (routine) without abnormal findings: Secondary | ICD-10-CM | POA: Diagnosis not present

## 2018-02-20 LAB — LIPID PANEL
Cholesterol: 186 mg/dL (ref <200)
HDL: 67 mg/dL (ref >50)
LDL Cholesterol (Calc): 103 mg/dL (calc) — ABNORMAL HIGH
NON-HDL CHOLESTEROL (CALC): 119 mg/dL (ref <130)
TRIGLYCERIDES: 72 mg/dL (ref <150)
Total CHOL/HDL Ratio: 2.8 (calc) (ref <5.0)

## 2018-02-20 LAB — COMPREHENSIVE METABOLIC PANEL
AG RATIO: 1.6 (calc) (ref 1.0–2.5)
ALBUMIN MSPROF: 4.6 g/dL (ref 3.6–5.1)
ALT: 19 U/L (ref 6–29)
AST: 24 U/L (ref 10–35)
Alkaline phosphatase (APISO): 52 U/L (ref 33–130)
BUN: 16 mg/dL (ref 7–25)
CHLORIDE: 107 mmol/L (ref 98–110)
CO2: 29 mmol/L (ref 20–32)
Calcium: 9.4 mg/dL (ref 8.6–10.4)
Creat: 0.75 mg/dL (ref 0.50–1.05)
GLOBULIN: 2.8 g/dL (ref 1.9–3.7)
GLUCOSE: 85 mg/dL (ref 65–99)
POTASSIUM: 4 mmol/L (ref 3.5–5.3)
SODIUM: 142 mmol/L (ref 135–146)
TOTAL PROTEIN: 7.4 g/dL (ref 6.1–8.1)
Total Bilirubin: 0.6 mg/dL (ref 0.2–1.2)

## 2018-02-20 LAB — CBC WITH DIFFERENTIAL/PLATELET
BASOS PCT: 0.8 %
Basophils Absolute: 38 cells/uL (ref 0–200)
EOS PCT: 1.3 %
Eosinophils Absolute: 61 cells/uL (ref 15–500)
HEMATOCRIT: 40.9 % (ref 35.0–45.0)
Hemoglobin: 13.4 g/dL (ref 11.7–15.5)
LYMPHS ABS: 2289 {cells}/uL (ref 850–3900)
MCH: 28.6 pg (ref 27.0–33.0)
MCHC: 32.8 g/dL (ref 32.0–36.0)
MCV: 87.2 fL (ref 80.0–100.0)
MONOS PCT: 7.6 %
MPV: 11.4 fL (ref 7.5–12.5)
NEUTROS ABS: 1955 {cells}/uL (ref 1500–7800)
Neutrophils Relative %: 41.6 %
Platelets: 256 10*3/uL (ref 140–400)
RBC: 4.69 10*6/uL (ref 3.80–5.10)
RDW: 13.4 % (ref 11.0–15.0)
Total Lymphocyte: 48.7 %
WBC: 4.7 10*3/uL (ref 3.8–10.8)
WBCMIX: 357 {cells}/uL (ref 200–950)

## 2018-02-20 MED ORDER — ALENDRONATE SODIUM 70 MG PO TABS: 70.0000 mg | ORAL_TABLET | ORAL | 4 refills | Status: DC

## 2018-02-20 MED ORDER — ESTRADIOL 10 MCG VA TABS: 1.0000 | ORAL_TABLET | VAGINAL | 11 refills | Status: DC

## 2018-02-20 NOTE — Patient Instructions (Signed)
Osteoporosis Osteoporosis is the thinning and loss of density in the bones. Osteoporosis makes the bones more brittle, fragile, and likely to break (fracture). Over time, osteoporosis can cause the bones to become so weak that they fracture after a simple fall. The bones most likely to fracture are the bones in the hip, wrist, and spine. What are the causes? The exact cause is not known. What increases the risk? Anyone can develop osteoporosis. You may be at greater risk if you have a family history of the condition or have poor nutrition. You may also have a higher risk if you are:  Female.  54 years old or older.  A smoker.  Not physically active.  White or Asian.  Slender.  What are the signs or symptoms? A fracture might be the first sign of the disease, especially if it results from a fall or injury that would not usually cause a bone to break. Other signs and symptoms include:  Low back and neck pain.  Stooped posture.  Height loss.  How is this diagnosed? To make a diagnosis, your health care provider may:  Take a medical history.  Perform a physical exam.  Order tests, such as: ? A bone mineral density test. ? A dual-energy X-ray absorptiometry test.  How is this treated? The goal of osteoporosis treatment is to strengthen your bones to reduce your risk of a fracture. Treatment may involve:  Making lifestyle changes, such as: ? Eating a diet rich in calcium. ? Doing weight-bearing and muscle-strengthening exercises. ? Stopping tobacco use. ? Limiting alcohol intake.  Taking medicine to slow the process of bone loss or to increase bone density.  Monitoring your levels of calcium and vitamin D.  Follow these instructions at home:  Include calcium and vitamin D in your diet. Calcium is important for bone health, and vitamin D helps the body absorb calcium.  Perform weight-bearing and muscle-strengthening exercises as directed by your health care  provider.  Do not use any tobacco products, including cigarettes, chewing tobacco, and electronic cigarettes. If you need help quitting, ask your health care provider.  Limit your alcohol intake.  Take medicines only as directed by your health care provider.  Keep all follow-up visits as directed by your health care provider. This is important.  Take precautions at home to lower your risk of falling, such as: ? Keeping rooms well lit and clutter free. ? Installing safety rails on stairs. ? Using rubber mats in the bathroom and other areas that are often wet or slippery. Get help right away if: You fall or injure yourself. This information is not intended to replace advice given to you by your health care provider. Make sure you discuss any questions you have with your health care provider. Document Released: 06/28/2005 Document Revised: 02/21/2016 Document Reviewed: 02/26/2014 Elsevier Interactive Patient Education  2018 [place] Maintenance for Postmenopausal Women Menopause is a normal process in which your reproductive ability comes to an end. This process happens gradually over a span of months to years, usually between the ages of 54 and 79. Menopause is complete when you have missed 12 consecutive menstrual periods. It is important to talk with your health care provider about some of the most common conditions that affect postmenopausal women, such as heart disease, cancer, and bone loss (osteoporosis). Adopting a healthy lifestyle and getting preventive care can help to promote your health and wellness. Those actions can also lower your chances of developing some of these common conditions.  What should I know about menopause? During menopause, you may experience a number of symptoms, such as:  Moderate-to-severe hot flashes.  Night sweats.  Decrease in sex drive.  Mood swings.  Headaches.  Tiredness.  Irritability.  Memory problems.  Insomnia.  Choosing  to treat or not to treat menopausal changes is an individual decision that you make with your health care provider. What should I know about hormone replacement therapy and supplements? Hormone therapy products are effective for treating symptoms that are associated with menopause, such as hot flashes and night sweats. Hormone replacement carries certain risks, especially as you become older. If you are thinking about using estrogen or estrogen with progestin treatments, discuss the benefits and risks with your health care provider. What should I know about heart disease and stroke? Heart disease, heart attack, and stroke become more likely as you age. This may be due, in part, to the hormonal changes that your body experiences during menopause. These can affect how your body processes dietary fats, triglycerides, and cholesterol. Heart attack and stroke are both medical emergencies. There are many things that you can do to help prevent heart disease and stroke:  Have your blood pressure checked at least every 1-2 years. High blood pressure causes heart disease and increases the risk of stroke.  If you are 54-21 years old, ask your health care provider if you should take aspirin to prevent a heart attack or a stroke.  Do not use any tobacco products, including cigarettes, chewing tobacco, or electronic cigarettes. If you need help quitting, ask your health care provider.  It is important to eat a healthy diet and maintain a healthy weight. ? Be sure to include plenty of vegetables, fruits, low-fat dairy products, and lean protein. ? Avoid eating foods that are high in solid fats, added sugars, or salt (sodium).  Get regular exercise. This is one of the most important things that you can do for your health. ? Try to exercise for at least 150 minutes each week. The type of exercise that you do should increase your heart rate and make you sweat. This is known as moderate-intensity exercise. ? Try to  do strengthening exercises at least twice each week. Do these in addition to the moderate-intensity exercise.  Know your numbers.Ask your health care provider to check your cholesterol and your blood glucose. Continue to have your blood tested as directed by your health care provider.  What should I know about cancer screening? There are several types of cancer. Take the following steps to reduce your risk and to catch any cancer development as early as possible. Breast Cancer  Practice breast self-awareness. ? This means understanding how your breasts normally appear and feel. ? It also means doing regular breast self-exams. Let your health care provider know about any changes, no matter how small.  If you are 69 or older, have a clinician do a breast exam (clinical breast exam or CBE) every year. Depending on your age, family history, and medical history, it may be recommended that you also have a yearly breast X-ray (mammogram).  If you have a family history of breast cancer, talk with your health care provider about genetic screening.  If you are at high risk for breast cancer, talk with your health care provider about having an MRI and a mammogram every year.  Breast cancer (BRCA) gene test is recommended for women who have family members with BRCA-related cancers. Results of the assessment will determine the need for  genetic counseling and BRCA1 and for BRCA2 testing. BRCA-related cancers include these types: ? Breast. This occurs in males or females. ? Ovarian. ? Tubal. This may also be called fallopian tube cancer. ? Cancer of the abdominal or pelvic lining (peritoneal cancer). ? Prostate. ? Pancreatic.  Cervical, Uterine, and Ovarian Cancer Your health care provider may recommend that you be screened regularly for cancer of the pelvic organs. These include your ovaries, uterus, and vagina. This screening involves a pelvic exam, which includes checking for microscopic changes to  the surface of your cervix (Pap test).  For women ages 21-65, health care providers may recommend a pelvic exam and a Pap test every three years. For women ages 67-65, they may recommend the Pap test and pelvic exam, combined with testing for human papilloma virus (HPV), every five years. Some types of HPV increase your risk of cervical cancer. Testing for HPV may also be done on women of any age who have unclear Pap test results.  Other health care providers may not recommend any screening for nonpregnant women who are considered low risk for pelvic cancer and have no symptoms. Ask your health care provider if a screening pelvic exam is right for you.  If you have had past treatment for cervical cancer or a condition that could lead to cancer, you need Pap tests and screening for cancer for at least 20 years after your treatment. If Pap tests have been discontinued for you, your risk factors (such as having a new sexual partner) need to be reassessed to determine if you should start having screenings again. Some women have medical problems that increase the chance of getting cervical cancer. In these cases, your health care provider may recommend that you have screening and Pap tests more often.  If you have a family history of uterine cancer or ovarian cancer, talk with your health care provider about genetic screening.  If you have vaginal bleeding after reaching menopause, tell your health care provider.  There are currently no reliable tests available to screen for ovarian cancer.  Lung Cancer Lung cancer screening is recommended for adults 2-81 years old who are at high risk for lung cancer because of a history of smoking. A yearly low-dose CT scan of the lungs is recommended if you:  Currently smoke.  Have a history of at least 30 pack-years of smoking and you currently smoke or have quit within the past 15 years. A pack-year is smoking an average of one pack of cigarettes per day for one  year.  Yearly screening should:  Continue until it has been 15 years since you quit.  Stop if you develop a health problem that would prevent you from having lung cancer treatment.  Colorectal Cancer  This type of cancer can be detected and can often be prevented.  Routine colorectal cancer screening usually begins at age 24 and continues through age 83.  If you have risk factors for colon cancer, your health care provider may recommend that you be screened at an earlier age.  If you have a family history of colorectal cancer, talk with your health care provider about genetic screening.  Your health care provider may also recommend using home test kits to check for hidden blood in your stool.  A small camera at the end of a tube can be used to examine your colon directly (sigmoidoscopy or colonoscopy). This is done to check for the earliest forms of colorectal cancer.  Direct examination of the colon  should be repeated every 5-10 years until age 53. However, if early forms of precancerous polyps or small growths are found or if you have a family history or genetic risk for colorectal cancer, you may need to be screened more often.  Skin Cancer  Check your skin from head to toe regularly.  Monitor any moles. Be sure to tell your health care provider: ? About any new moles or changes in moles, especially if there is a change in a mole's shape or color. ? If you have a mole that is larger than the size of a pencil eraser.  If any of your family members has a history of skin cancer, especially at a Allison Neal age, talk with your health care provider about genetic screening.  Always use sunscreen. Apply sunscreen liberally and repeatedly throughout the day.  Whenever you are outside, protect yourself by wearing long sleeves, pants, a wide-brimmed hat, and sunglasses.  What should I know about osteoporosis? Osteoporosis is a condition in which bone destruction happens more quickly than  new bone creation. After menopause, you may be at an increased risk for osteoporosis. To help prevent osteoporosis or the bone fractures that can happen because of osteoporosis, the following is recommended:  If you are 4-22 years old, get at least 1,000 mg of calcium and at least 600 mg of vitamin D per day.  If you are older than age 53 but younger than age 79, get at least 1,200 mg of calcium and at least 600 mg of vitamin D per day.  If you are older than age 59, get at least 1,200 mg of calcium and at least 800 mg of vitamin D per day.  Smoking and excessive alcohol intake increase the risk of osteoporosis. Eat foods that are rich in calcium and vitamin D, and do weight-bearing exercises several times each week as directed by your health care provider. What should I know about how menopause affects my mental health? Depression may occur at any age, but it is more common as you become older. Common symptoms of depression include:  Low or sad mood.  Changes in sleep patterns.  Changes in appetite or eating patterns.  Feeling an overall lack of motivation or enjoyment of activities that you previously enjoyed.  Frequent crying spells.  Talk with your health care provider if you think that you are experiencing depression. What should I know about immunizations? It is important that you get and maintain your immunizations. These include:  Tetanus, diphtheria, and pertussis (Tdap) booster vaccine.  Influenza every year before the flu season begins.  Pneumonia vaccine.  Shingles vaccine.  Your health care provider may also recommend other immunizations. This information is not intended to replace advice given to you by your health care provider. Make sure you discuss any questions you have with your health care provider. Document Released: 11/10/2005 Document Revised: 04/07/2016 Document Reviewed: 06/22/2015 Elsevier Interactive Patient Education  2018 [employer].

## 2018-02-20 NOTE — Progress Notes (Signed)
Allison Neal 1966/03/23 [id number]    History:    Presents for annual exam.  Postmenopausal no HRT.  2011 TAH for fibroids.  Osteoporosis 2018  T score -2.7 right femoral neck tolerating Fosamax since 2016.  Normal Pap and mammogram history.  Negative colonoscopy at age 52 in [place] 10-year follow-up.  Past medical history, past surgical history, family history and social history were all reviewed and documented in the EPIC chart.  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  Active job.  3 daughters all doing well 4 grandchildren.  ROS:  A ROS was performed and pertinent positives and negatives are included.  Exam:  Vitals:   02/20/18 0757  BP: 114/62  Weight: 144 lb (65.3 kg)  Height:  (1.702 m)   Body mass index is 22.55 kg/m.   General appearance:  Normal Thyroid:  Symmetrical, normal in size, without palpable masses or nodularity. Respiratory  Auscultation:  Clear without wheezing or rhonchi Cardiovascular  Auscultation:  Regular rate, without rubs, murmurs or gallops  Edema/varicosities:  Not grossly evident Abdominal  Soft,nontender, without masses, guarding or rebound.  Liver/spleen:  No organomegaly noted  Hernia:  None appreciated  Skin  Inspection:  Grossly normal   Breasts: Examined lying and sitting.     Right: Without masses, retractions, discharge or axillary adenopathy.     Left: Without masses, retractions, discharge or axillary adenopathy. Gentitourinary   Inguinal/mons:  Normal without inguinal adenopathy  External genitalia:  Normal  BUS/Urethra/Skene's glands:  Normal  Vagina: Mild atrophy  Cervix:  absent  Uterus:  absent  Adnexa/parametria:     Rt: Without masses or tenderness.   Lt: Without masses or tenderness.  Anus and perineum: Normal  Digital rectal exam: Normal sphincter tone without palpated masses or tenderness  Assessment/Plan:  52 y.o. WF G3, P3 for annual exam with no  complaints.  2011 TAH for fibroids on no HRT Mild vaginal atrophy on Vagifem Osteoporosis on Fosamax tolerating well  Plan: Fosamax 70 mg p.o. weekly prescription, proper use, slight risk of for GERD reviewed, reviewed importance of staying upright after taking.  Vagifem 1 applicator twice weekly prescription, proper use given and reviewed.  Continue over-the-counter vaginal lubricants as needed.  SBE's, continue annual screening mammogram, calcium rich foods, tinea vitamin D 2000 daily, weightbearing exercise reviewed.  Encouraged yoga.  Home safety, fall prevention discussed.  CBC, lipid panel, CMP.Harrington Challenger [facility], 8:43 AM 02/20/2018

## 2018-11-08 ENCOUNTER — Other Ambulatory Visit: Payer: Self-pay | Admitting: Obstetrics & Gynecology

## 2018-11-08 DIAGNOSIS — Z1231 Encounter for screening mammogram for malignant neoplasm of breast: Secondary | ICD-10-CM

## 2018-12-03 ENCOUNTER — Ambulatory Visit: Payer: BLUE CROSS/BLUE SHIELD

## 2018-12-19 ENCOUNTER — Inpatient Hospital Stay: Admission: RE | Admit: 2018-12-19 | Payer: BLUE CROSS/BLUE SHIELD | Source: Ambulatory Visit

## 2019-01-28 ENCOUNTER — Ambulatory Visit: Payer: Self-pay

## 2019-02-25 ENCOUNTER — Encounter: Payer: BLUE CROSS/BLUE SHIELD | Admitting: Women's Health

## 2019-03-10 ENCOUNTER — Ambulatory Visit
Admission: RE | Admit: 2019-03-10 | Discharge: 2019-03-10 | Disposition: A | Discharge status: Home / Self Care | Payer: BC Managed Care – PPO | Source: Ambulatory Visit | Attending: Obstetrics & Gynecology | Admitting: Obstetrics & Gynecology

## 2019-03-10 ENCOUNTER — Other Ambulatory Visit: Payer: Self-pay

## 2019-03-10 ENCOUNTER — Ambulatory Visit (INDEPENDENT_AMBULATORY_CARE_PROVIDER_SITE_OTHER): Payer: BC Managed Care – PPO | Admitting: Women's Health

## 2019-03-10 ENCOUNTER — Encounter: Payer: Self-pay | Admitting: Women's Health

## 2019-03-10 VITALS — BP 110/78 | Ht 67.0 in | Wt 139.0 lb

## 2019-03-10 DIAGNOSIS — M810 Age-related osteoporosis without current pathological fracture: Secondary | ICD-10-CM

## 2019-03-10 DIAGNOSIS — M8000XS Age-related osteoporosis with current pathological fracture, unspecified site, sequela: Secondary | ICD-10-CM

## 2019-03-10 DIAGNOSIS — Z13220 Encounter for screening for lipoid disorders: Secondary | ICD-10-CM

## 2019-03-10 DIAGNOSIS — E559 Vitamin D deficiency, unspecified: Secondary | ICD-10-CM | POA: Diagnosis not present

## 2019-03-10 DIAGNOSIS — Z1231 Encounter for screening mammogram for malignant neoplasm of breast: Secondary | ICD-10-CM

## 2019-03-10 DIAGNOSIS — Z01419 Encounter for gynecological examination (general) (routine) without abnormal findings: Secondary | ICD-10-CM

## 2019-03-10 MED ORDER — ESTRADIOL 10 MCG VA TABS: ORAL_TABLET | VAGINAL | 4 refills | Status: DC

## 2019-03-10 MED ORDER — ALENDRONATE SODIUM 70 MG PO TABS: 70.0000 mg | ORAL_TABLET | ORAL | 4 refills | Status: DC

## 2019-03-10 NOTE — Patient Instructions (Addendum)
Health Maintenance for Postmenopausal Women Menopause is a normal process in which your reproductive ability comes to an end. This process happens gradually over a span of months to years, usually between the ages of 62 and 89. Menopause is complete when you have missed 12 consecutive menstrual periods. It is important to talk with your health care provider about some of the most common conditions that affect postmenopausal women, such as heart disease, cancer, and bone loss (osteoporosis). Adopting a healthy lifestyle and getting preventive care can help to promote your health and wellness. Those actions can also lower your chances of developing some of these common conditions. What should I know about menopause? During menopause, you may experience a number of symptoms, such as:  Moderate-to-severe hot flashes.  Night sweats.  Decrease in sex drive.  Mood swings.  Headaches.  Tiredness.  Irritability.  Memory problems.  Insomnia. Choosing to treat or not to treat menopausal changes is an individual decision that you make with your health care provider. What should I know about hormone replacement therapy and supplements? Hormone therapy products are effective for treating symptoms that are associated with menopause, such as hot flashes and night sweats. Hormone replacement carries certain risks, especially as you become older. If you are thinking about using estrogen or estrogen with progestin treatments, discuss the benefits and risks with your health care provider. What should I know about heart disease and stroke? Heart disease, heart attack, and stroke become more likely as you age. This may be due, in part, to the hormonal changes that your body experiences during menopause. These can affect how your body processes dietary fats, triglycerides, and cholesterol. Heart attack and stroke are both medical emergencies. There are many things that you can do to help prevent heart disease  and stroke:  Have your blood pressure checked at least every 1-2 years. High blood pressure causes heart disease and increases the risk of stroke.  If you are 79-72 years old, ask your health care provider if you should take aspirin to prevent a heart attack or a stroke.  Do not use any tobacco products, including cigarettes, chewing tobacco, or electronic cigarettes. If you need help quitting, ask your health care provider.  It is important to eat a healthy diet and maintain a healthy weight. ? Be sure to include plenty of vegetables, fruits, low-fat dairy products, and lean protein. ? Avoid eating foods that are high in solid fats, added sugars, or salt (sodium).  Get regular exercise. This is one of the most important things that you can do for your health. ? Try to exercise for at least 150 minutes each week. The type of exercise that you do should increase your heart rate and make you sweat. This is known as moderate-intensity exercise. ? Try to do strengthening exercises at least twice each week. Do these in addition to the moderate-intensity exercise.  Know your numbers.Ask your health care provider to check your cholesterol and your blood glucose. Continue to have your blood tested as directed by your health care provider.  What should I know about cancer screening? There are several types of cancer. Take the following steps to reduce your risk and to catch any cancer development as early as possible. Breast Cancer  Practice breast self-awareness. ? This means understanding how your breasts normally appear and feel. ? It also means doing regular breast self-exams. Let your health care provider know about any changes, no matter how small.  If you are 40 or  older, have a clinician do a breast exam (clinical breast exam or CBE) every year. Depending on your age, family history, and medical history, it may be recommended that you also have a yearly breast X-ray (mammogram).  If you  have a family history of breast cancer, talk with your health care provider about genetic screening.  If you are at high risk for breast cancer, talk with your health care provider about having an MRI and a mammogram every year.  Breast cancer (BRCA) gene test is recommended for women who have family members with BRCA-related cancers. Results of the assessment will determine the need for genetic counseling and BRCA1 and for BRCA2 testing. BRCA-related cancers include these types: ? Breast. This occurs in males or females. ? Ovarian. ? Tubal. This may also be called fallopian tube cancer. ? Cancer of the abdominal or pelvic lining (peritoneal cancer). ? Prostate. ? Pancreatic. Cervical, Uterine, and Ovarian Cancer Your health care provider may recommend that you be screened regularly for cancer of the pelvic organs. These include your ovaries, uterus, and vagina. This screening involves a pelvic exam, which includes checking for microscopic changes to the surface of your cervix (Pap test).  For women ages 21-65, health care providers may recommend a pelvic exam and a Pap test every three years. For women ages 39-65, they may recommend the Pap test and pelvic exam, combined with testing for human papilloma virus (HPV), every five years. Some types of HPV increase your risk of cervical cancer. Testing for HPV may also be done on women of any age who have unclear Pap test results.  Other health care providers may not recommend any screening for nonpregnant women who are considered low risk for pelvic cancer and have no symptoms. Ask your health care provider if a screening pelvic exam is right for you.  If you have had past treatment for cervical cancer or a condition that could lead to cancer, you need Pap tests and screening for cancer for at least 20 years after your treatment. If Pap tests have been discontinued for you, your risk factors (such as having a new sexual partner) need to be reassessed  to determine if you should start having screenings again. Some women have medical problems that increase the chance of getting cervical cancer. In these cases, your health care provider may recommend that you have screening and Pap tests more often.  If you have a family history of uterine cancer or ovarian cancer, talk with your health care provider about genetic screening.  If you have vaginal bleeding after reaching menopause, tell your health care provider.  There are currently no reliable tests available to screen for ovarian cancer. Lung Cancer Lung cancer screening is recommended for adults 57-50 years old who are at high risk for lung cancer because of a history of smoking. A yearly low-dose CT scan of the lungs is recommended if you:  Currently smoke.  Have a history of at least 30 pack-years of smoking and you currently smoke or have quit within the past 15 years. A pack-year is smoking an average of one pack of cigarettes per day for one year. Yearly screening should:  Continue until it has been 15 years since you quit.  Stop if you develop a health problem that would prevent you from having lung cancer treatment. Colorectal Cancer  This type of cancer can be detected and can often be prevented.  Routine colorectal cancer screening usually begins at age 12 and continues through  age 63.  If you have risk factors for colon cancer, your health care provider may recommend that you be screened at an earlier age.  If you have a family history of colorectal cancer, talk with your health care provider about genetic screening.  Your health care provider may also recommend using home test kits to check for hidden blood in your stool.  A small camera at the end of a tube can be used to examine your colon directly (sigmoidoscopy or colonoscopy). This is done to check for the earliest forms of colorectal cancer.  Direct examination of the colon should be repeated every 5-10 years until  age 75. However, if early forms of precancerous polyps or small growths are found or if you have a family history or genetic risk for colorectal cancer, you may need to be screened more often. Skin Cancer  Check your skin from head to toe regularly.  Monitor any moles. Be sure to tell your health care provider: ? About any new moles or changes in moles, especially if there is a change in a mole's shape or color. ? If you have a mole that is larger than the size of a pencil eraser.  If any of your family members has a history of skin cancer, especially at a Cherilynn Schomburg age, talk with your health care provider about genetic screening.  Always use sunscreen. Apply sunscreen liberally and repeatedly throughout the day.  Whenever you are outside, protect yourself by wearing long sleeves, pants, a wide-brimmed hat, and sunglasses. What should I know about osteoporosis? Osteoporosis is a condition in which bone destruction happens more quickly than new bone creation. After menopause, you may be at an increased risk for osteoporosis. To help prevent osteoporosis or the bone fractures that can happen because of osteoporosis, the following is recommended:  If you are 59-59 years old, get at least 1,000 mg of calcium and at least 600 mg of vitamin D per day.  If you are older than age 36 but younger than age 32, get at least 1,200 mg of calcium and at least 600 mg of vitamin D per day.  If you are older than age 47, get at least 1,200 mg of calcium and at least 800 mg of vitamin D per day. Smoking and excessive alcohol intake increase the risk of osteoporosis. Eat foods that are rich in calcium and vitamin D, and do weight-bearing exercises several times each week as directed by your health care provider. What should I know about how menopause affects my mental health? Depression may occur at any age, but it is more common as you become older. Common symptoms of depression include:  Low or sad mood.   Changes in sleep patterns.  Changes in appetite or eating patterns.  Feeling an overall lack of motivation or enjoyment of activities that you previously enjoyed.  Frequent crying spells. Talk with your health care provider if you think that you are experiencing depression. What should I know about immunizations? It is important that you get and maintain your immunizations. These include:  Tetanus, diphtheria, and pertussis (Tdap) booster vaccine.  Influenza every year before the flu season begins.  Pneumonia vaccine.  Shingles vaccine. Your health care provider may also recommend other immunizations. This information is not intended to replace advice given to you by your health care provider. Make sure you discuss any questions you have with your health care provider. Document Released: 11/10/2005 Document Revised: 04/07/2016 Document Reviewed: 06/22/2015 Elsevier Interactive Patient Education  2019 [place].  Osteoporosis  Osteoporosis is thinning and loss of density in your bones. Osteoporosis makes bones more brittle and fragile and more likely to break (fracture). Over time, osteoporosis can cause your bones to become so weak that they fracture after a minor fall. Bones in the hip, wrist, and spine are most likely to fracture due to osteoporosis. What are the causes? The exact cause of this condition is not known. What increases the risk? You may be at greater risk for osteoporosis if you:  Have a family history of the condition.  Have poor nutrition.  Use steroid medicines, such as prednisone.  Are female.  Are age 31 or older.  Smoke or have a history of smoking.  Are not physically active (are sedentary).  Are white (Caucasian) or of Asian descent.  Have a small body frame.  Take certain medicines, such as antiseizure medicines. What are the signs or symptoms? A fracture might be the first sign of osteoporosis, especially if the fracture results from a  fall or injury that usually would not cause a bone to break. Other signs and symptoms include:  Pain in the neck or low back.  Stooped posture.  Loss of height. How is this diagnosed? This condition may be diagnosed based on:  Your medical history.  A physical exam.  A bone mineral density test, also called a DXA or DEXA test (dual-energy X-ray absorptiometry test). This test uses X-rays to measure the amount of minerals in your bones. How is this treated? The goal of treatment is to strengthen your bones and lower your risk for a fracture. Treatment may involve:  Making lifestyle changes, such as: ? Including foods with more calcium and vitamin D in your diet. ? Doing weight-bearing and muscle-strengthening exercises. ? Stopping tobacco use. ? Limiting alcohol intake.  Taking medicine to slow the process of bone loss or to increase bone density.  Taking daily supplements of calcium and vitamin D.  Taking hormone replacement medicines, such as estrogen for women and testosterone for men.  Monitoring your levels of calcium and vitamin D. Follow these instructions at home:  Activity  Exercise as told by your health care provider. Ask your health care provider what exercises and activities are safe for you. You should do: ? Exercises that make you work against gravity (weight-bearing exercises), such as tai chi, yoga, or walking. ? Exercises to strengthen muscles, such as lifting weights. Lifestyle  Limit alcohol intake to no more than 1 drink a day for nonpregnant women and 2 drinks a day for men. One drink equals 12 oz of beer, 5 oz of wine, or 1 oz of hard liquor.  Do not use any products that contain nicotine or tobacco, such as cigarettes and e-cigarettes. If you need help quitting, ask your health care provider. Preventing falls  Use devices to help you move around (mobility aids) as needed, such as canes, walkers, scooters, or crutches.  Keep rooms well-lit and  clutter-free.  Remove tripping hazards from walkways, including cords and throw rugs.  Install grab bars in bathrooms and safety rails on stairs.  Use rubber mats in the bathroom and other areas that are often wet or slippery.  Wear closed-toe shoes that fit well and support your feet. Wear shoes that have rubber soles or low heels.  Review your medicines with your health care provider. Some medicines can cause dizziness or changes in blood pressure, which can increase your risk of falling. General instructions  Include  calcium and vitamin D in your diet. Calcium is important for bone health, and vitamin D helps your body to absorb calcium. Good sources of calcium and vitamin D include: ? Certain fatty fish, such as salmon and tuna. ? Products that have calcium and vitamin D added to them (fortified products), such as fortified cereals. ? Egg yolks. ? Cheese. ? Liver.  Take over-the-counter and prescription medicines only as told by your health care provider.  Keep all follow-up visits as told by your health care provider. This is important. Contact a health care provider if:  You have never been screened for osteoporosis and you are: ? A woman who is age 61 or older. ? A man who is age 78 or older. Get help right away if:  You fall or injure yourself. Summary  Osteoporosis is thinning and loss of density in your bones. This makes bones more brittle and fragile and more likely to break (fracture),even with minor falls.  The goal of treatment is to strengthen your bones and reduce your risk for a fracture.  Include calcium and vitamin D in your diet. Calcium is important for bone health, and vitamin D helps your body to absorb calcium.  Talk with your health care provider about screening for osteoporosis if you are a woman who is age 2 or older, or a man who is age 8 or older. This information is not intended to replace advice given to you by your health care provider. Make  sure you discuss any questions you have with your health care provider. Document Released: 06/28/2005 Document Revised: 07/13/2017 Document Reviewed: 07/13/2017 Elsevier Interactive Patient Education  2019 [employer].

## 2019-03-10 NOTE — Progress Notes (Signed)
Allison Neal Jun 26, 1966 [id number]    History:    Presents for annual exam.  2011 TAH for fibroids.  Normal Pap and mammogram history.  Has been on Fosamax since 2016.  2018 T score -2.7 at the femoral neck history of a broken foot.  Negative colonoscopy at age 53  Past medical history, past surgical history, family history and social history were all reviewed and documented in the EPIC chart.  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  ROS:  A ROS was performed and pertinent positives and negatives are included.  Exam:  Vitals:   03/10/19 0901  BP: 110/78  Weight: 139 lb (63 kg)  Height: '5\' 7"'  (1.702 m)   Body mass index is 21.77 kg/m.   General appearance:  Normal Thyroid:  Symmetrical, normal in size, without palpable masses or nodularity. Respiratory  Auscultation:  Clear without wheezing or rhonchi Cardiovascular  Auscultation:  Regular rate, without rubs, murmurs or gallops  Edema/varicosities:  Not grossly evident Abdominal  Soft,nontender, without masses, guarding or rebound.  Liver/spleen:  No organomegaly noted  Hernia:  None appreciated  Skin  Inspection:  Grossly normal   Breasts: Examined lying and sitting.     Right: Without masses, retractions, discharge or axillary adenopathy.     Left: Without masses, retractions, discharge or axillary adenopathy. Gentitourinary   Inguinal/mons:  Normal without inguinal adenopathy  External genitalia:  Normal  BUS/Urethra/Skene's glands:  Normal  Vagina: Atrophic  Cervix: Absent uterus: Absent  Adnexa/parametria:     Rt: Without masses or tenderness.   Lt: Without masses or tenderness.  Anus and perineum: Normal  Digital rectal exam: Normal sphincter tone without palpated masses or tenderness  Assessment/Plan:  53 y.o. MWF G3, P3 for annual exam with dyspareunia/vaginal dryness increasing right sided  headaches no aura, visual changes or vertigo.  2011 TAH for fibroids with atrophic  vaginitis Osteoporosis on Fosamax since 2016  Plan: Repeat DEXA.  Reviewed importance of continuing regular cardio and balance type exercise, yoga, tai chi or Pilates encouraged.  Home safety, fall prevention discussed.  Fosamax 70 mg weekly, proper administration given and reviewed, reviewed best to stay on for 5 years will stop in 2021.  Questions if headaches are related to working in the heat, work spaces scheduled to get air conditioning, if headaches persist follow-up with primary care.  SBEs, continue annual screening mammogram has scheduled today, calcium rich foods, vitamin D 2000 daily encouraged.  CBC, CMP, lipid panel, vitamin D.  [place], 9:34 AM 03/10/2019

## 2019-03-11 LAB — CBC WITH DIFFERENTIAL/PLATELET
Absolute Monocytes: 355 cells/uL (ref 200–950)
Basophils Absolute: 58 cells/uL (ref 0–200)
Basophils Relative: 1.1 %
Eosinophils Absolute: 80 cells/uL (ref 15–500)
Eosinophils Relative: 1.5 %
HCT: 42.7 % (ref 35.0–45.0)
Hemoglobin: 14.2 g/dL (ref 11.7–15.5)
Lymphs Abs: 2533 cells/uL (ref 850–3900)
MCH: 29.3 pg (ref 27.0–33.0)
MCHC: 33.3 g/dL (ref 32.0–36.0)
MCV: 88.2 fL (ref 80.0–100.0)
MPV: 11.4 fL (ref 7.5–12.5)
Monocytes Relative: 6.7 %
Neutro Abs: 2274 cells/uL (ref 1500–7800)
Neutrophils Relative %: 42.9 %
Platelets: 269 10*3/uL (ref 140–400)
RBC: 4.84 10*6/uL (ref 3.80–5.10)
RDW: 13 % (ref 11.0–15.0)
Total Lymphocyte: 47.8 %
WBC: 5.3 10*3/uL (ref 3.8–10.8)

## 2019-03-11 LAB — COMPREHENSIVE METABOLIC PANEL
AG Ratio: 1.7 (calc) (ref 1.0–2.5)
ALT: 20 U/L (ref 6–29)
AST: 25 U/L (ref 10–35)
Albumin: 4.8 g/dL (ref 3.6–5.1)
Alkaline phosphatase (APISO): 44 U/L (ref 37–153)
BUN: 11 mg/dL (ref 7–25)
CO2: 25 mmol/L (ref 20–32)
Calcium: 9.7 mg/dL (ref 8.6–10.4)
Chloride: 106 mmol/L (ref 98–110)
Creat: 0.75 mg/dL (ref 0.50–1.05)
Globulin: 2.8 g/dL (calc) (ref 1.9–3.7)
Glucose, Bld: 80 mg/dL (ref 65–99)
Potassium: 4 mmol/L (ref 3.5–5.3)
Sodium: 141 mmol/L (ref 135–146)
Total Bilirubin: 0.5 mg/dL (ref 0.2–1.2)
Total Protein: 7.6 g/dL (ref 6.1–8.1)

## 2019-03-11 LAB — LIPID PANEL
Cholesterol: 198 mg/dL (ref <200)
HDL: 75 mg/dL (ref >=50)
LDL Cholesterol (Calc): 103 mg/dL (calc) — ABNORMAL HIGH
Non-HDL Cholesterol (Calc): 123 mg/dL (calc) (ref <130)
Total CHOL/HDL Ratio: 2.6 (calc) (ref <5.0)
Triglycerides: 102 mg/dL (ref <150)

## 2019-03-11 LAB — VITAMIN D 25 HYDROXY (VIT D DEFICIENCY, FRACTURES): Vit D, 25-Hydroxy: 35 ng/mL (ref 30–100)

## 2019-05-07 ENCOUNTER — Ambulatory Visit (INDEPENDENT_AMBULATORY_CARE_PROVIDER_SITE_OTHER): Payer: BC Managed Care – PPO

## 2019-05-07 ENCOUNTER — Other Ambulatory Visit: Payer: Self-pay | Admitting: Women's Health

## 2019-05-07 ENCOUNTER — Other Ambulatory Visit: Payer: Self-pay

## 2019-05-07 DIAGNOSIS — M8589 Other specified disorders of bone density and structure, multiple sites: Secondary | ICD-10-CM | POA: Diagnosis not present

## 2019-05-07 DIAGNOSIS — Z13820 Encounter for screening for osteoporosis: Secondary | ICD-10-CM

## 2019-05-07 DIAGNOSIS — Z780 Asymptomatic menopausal state: Secondary | ICD-10-CM

## 2019-05-07 DIAGNOSIS — M8000XS Age-related osteoporosis with current pathological fracture, unspecified site, sequela: Secondary | ICD-10-CM

## 2019-06-06 ENCOUNTER — Encounter: Payer: Self-pay | Admitting: Women's Health

## 2019-06-10 NOTE — Telephone Encounter (Signed)
Dr. Marguerita Merles- I will explain to patient that results do not interface with My Chart.  What to advise her about her bone density result?

## 2020-01-12 MED ORDER — ESTRADIOL 0.5 MG PO TABS: 0.5000 mg | ORAL_TABLET | Freq: Every day | ORAL | 0 refills | Status: DC

## 2020-01-12 NOTE — Telephone Encounter (Signed)
Patient is stating generic vagifem is too expensive, would like other options.

## 2020-01-12 NOTE — Telephone Encounter (Signed)
Allison Neal patient is willing to try oral estradiol. Am I sending in estradiol 0.5 gm tablet?

## 2020-03-15 ENCOUNTER — Encounter: Payer: BC Managed Care – PPO | Admitting: Nurse Practitioner

## 2020-03-16 ENCOUNTER — Ambulatory Visit (INDEPENDENT_AMBULATORY_CARE_PROVIDER_SITE_OTHER): Payer: BC Managed Care – PPO | Admitting: Physician Assistant

## 2020-03-16 ENCOUNTER — Encounter: Payer: Self-pay | Admitting: Physician Assistant

## 2020-03-16 ENCOUNTER — Other Ambulatory Visit: Payer: Self-pay

## 2020-03-16 VITALS — BP 102/78 | HR 66 | Temp 97.6°F | Ht 67.0 in | Wt 147.0 lb

## 2020-03-16 DIAGNOSIS — Z0000 Encounter for general adult medical examination without abnormal findings: Secondary | ICD-10-CM

## 2020-03-16 DIAGNOSIS — M818 Other osteoporosis without current pathological fracture: Secondary | ICD-10-CM

## 2020-03-16 NOTE — Patient Instructions (Signed)

## 2020-03-16 NOTE — Progress Notes (Addendum)
Wellness physical  Subjective:    Patient ID: Allison Neal, female    DOB: [birth year], 54 y.o.   MRN: [id number]  Chief Complaint  Patient presents with  . Annual Exam    HPI Patient is in today for physical  .   Encounter for general adult medical examination without abnormal findings  Physical ("At Risk" items are starred): Patient's last physical exam was 1 year ago .  Weight: Appropriate for height (BMI less than 27%) ;  Blood Pressure: Normal (BP less than 120/80) ;  Medical History: Patient history reviewed ; Family history reviewed ;  Allergies Reviewed: No change in current allergies ;  Medications Reviewed: Medications reviewed - no changes ;  Lipids: Normal lipid levels ;  Smoking: Life-long non-smoker ;  Alcohol/Drug Use: Is a non-drinker ; No illicit drug use ;  Safety: reviewed ; Patient wears a seat belt, has smoke detectors,  practices appropriate gun safety, Dental Care: biannual cleanings, brushes and flosses daily. Ophthalmology/Optometry: Annual visit.  Hearing loss: none Vision impairments: none  Past Medical History:  Diagnosis Date  . Broken foot SUMMER 2011   RIGHT  . Environmental allergies   . Osteoporosis   . Vitamin D deficiency     Past Surgical History:  Procedure Laterality Date  . [place] SURGERY  2009  . ENDOMETRIAL ABLATION  2010  . GALLBLADDER SURGERY  2007  . TONSILLECTOMY  2004  . UMBILICAL HERNIA REPAIR    . VAGINAL HYSTERECTOMY  11-25-09    Family History  Problem Relation Age of Onset  . Diabetes Mother   . Heart disease Mother   . Cancer Mother        ??UTERINE CANCER OR FIBROIDS??  . Diabetes Maternal Grandmother   . Thyroid disease Father   . Heart disease Father   . Cancer Father        ? Testicular    Social History   Socioeconomic History  . Marital status: Married    Spouse name: Not on file  . Number of children: Not on file  . Years of education: Not on file  . Highest education level: Not  on file  Occupational History  . Not on file  Tobacco Use  . Smoking status: Never Smoker  . Smokeless tobacco: Never Used  Vaping Use  . Vaping Use: Never used  Substance and Sexual Activity  . Alcohol use: No    Alcohol/week: 0.0 standard drinks  . Drug use: No  . Sexual activity: Yes    Birth control/protection: Surgical    Comment: 1st intercourse 54 yo-Fewer than 5 partners  Other Topics Concern  . Not on file  Social History Narrative  . Not on file   Social Determinants of Health   Financial Resource Strain:   . Difficulty of Paying Living Expenses:   Food Insecurity:   . Worried About Charity fundraiser in the Last Year:   . Arboriculturist in the Last Year:   Transportation Needs:   . Film/video editor (Medical):   Marland Kitchen Lack of Transportation (Non-Medical):   Physical Activity:   . Days of Exercise per Week:   . Minutes of Exercise per Session:   Stress:   . Feeling of Stress :   Social Connections:   . Frequency of Communication with Friends and Family:   . Frequency of Social Gatherings with Friends and Family:   . Attends Religious Services:   . Active Member of Clubs  or Organizations:   . Attends Banker Meetings:   Marland Kitchen Marital Status:   Intimate Partner Violence:   . Fear of Current or Ex-Partner:   . Emotionally Abused:   Marland Kitchen Physically Abused:   . Sexually Abused:      Current Outpatient Medications:  .  alendronate (FOSAMAX) 70 MG tablet, Take 1 tablet (70 mg total) by mouth every 7 (seven) days. Take with a full glass of water on an empty stomach., Disp: 12 tablet, Rfl: 4 .  Cholecalciferol (VITAMIN D PO), Take by mouth., Disp: , Rfl:  .  estradiol (ESTRACE) 0.5 MG tablet, Take 1 tablet (0.5 mg total) by mouth daily., Disp: 90 tablet, Rfl: 0 .  Estradiol 10 MCG TABS vaginal tablet, Daily vaginally for 2 wks and then 2x/wk, Disp: 24 tablet, Rfl: 4   Allergies  Allergen Reactions  . Other     DUST, MOLD, CATS    Review of  Systems  CONSTITUTIONAL: Negative for chills, fatigue, fever, unintentional weight gain and unintentional weight loss.  E/N/T: Negative for ear pain, nasal congestion and sore throat.  CARDIOVASCULAR: Negative for chest pain, dizziness, palpitations and pedal edema.  RESPIRATORY: Negative for recent cough and dyspnea.  GASTROINTESTINAL: Negative for abdominal pain, acid reflux symptoms, constipation, diarrhea, nausea and vomiting.  MSK: Negative for arthralgias and myalgias.  INTEGUMENTARY: Negative for rash.  NEUROLOGICAL: Negative for dizziness and headaches.  PSYCHIATRIC: Negative for sleep disturbance and to question depression screen.  Negative for depression, negative for anhedonia.       Objective:    Physical Exam Today's Vitals   03/16/20 0909  BP: 102/78  Pulse: 66  Temp: 97.6 F (36.4 C)  TempSrc: Temporal  SpO2: 100%  Weight: 147 lb (66.7 kg)  Height: 5\' 7"  (1.702 m)   Body mass index is 23.02 kg/m. PHYSICAL EXAM:   VS: BP 102/78 (BP Location: Left Arm, Patient Position: Sitting)   Pulse 66   Temp 97.6 F (36.4 C) (Temporal)   Ht 5\' 7"  (1.702 m)   Wt 147 lb (66.7 kg)   SpO2 100%   BMI 23.02 kg/m   GEN: Well nourished, well developed, in no acute distress  HEENT: normal external ears and nose - normal external auditory canals and TMS - hearing grossly normal -  - Lips, Teeth and Gums - normal  Oropharynx - normal mucosa, palate, and posterior pharynx Neck: no JVD or masses - no thyromegaly Cardiac: RRR; no murmurs, rubs, or gallops,no edema - no significant varicosities Respiratory:  normal respiratory rate and pattern with no distress - normal breath sounds with no rales, rhonchi, wheezes or rubs Breasts - normal - no masses GI: normal bowel sounds, no masses or tenderness - rectal exam normal - negative hemoccult MS: no deformity or atrophy  GU - vaginal vault normal - no masses (hx of partial hyst) Skin: warm and dry, no rash  Neuro:  Alert and  Oriented x 3, Strength and sensation are intact - CN II-Xii grossly intact Psych: euthymic mood, appropriate affect and demeanor   Wt Readings from Last 3 Encounters:  03/16/20 147 lb (66.7 kg)  03/10/19 139 lb (63 kg)  02/20/18 144 lb (65.3 kg)    Health Maintenance Due  Topic Date Due  . HIV Screening  Never done  . PAP SMEAR-Modifier  Never done    There are no preventive care reminders to display for this patient.   Lab Results  Component Value Date   TSH 2.33  02/14/2016   Lab Results  Component Value Date   WBC 5.3 03/10/2019   HGB 14.2 03/10/2019   HCT 42.7 03/10/2019   MCV 88.2 03/10/2019   PLT 269 03/10/2019   Lab Results  Component Value Date   NA 141 03/10/2019   K 4.0 03/10/2019   CO2 25 03/10/2019   GLUCOSE 80 03/10/2019   BUN 11 03/10/2019   CREATININE 0.75 03/10/2019   BILITOT 0.5 03/10/2019   ALKPHOS 54 02/19/2017   AST 25 03/10/2019   ALT 20 03/10/2019   PROT 7.6 03/10/2019   ALBUMIN 4.6 02/19/2017   CALCIUM 9.7 03/10/2019   Lab Results  Component Value Date   CHOL 198 03/10/2019   Lab Results  Component Value Date   HDL 75 03/10/2019   Lab Results  Component Value Date   LDLCALC 103 (H) 03/10/2019   Lab Results  Component Value Date   TRIG 102 03/10/2019   Lab Results  Component Value Date   CHOLHDL 2.6 03/10/2019   No results found for: HGBA1C     Assessment & Plan:   Problem List Items Addressed This Visit    None    Visit Diagnoses    Routine general medical examination at a health care facility    -  Primary   Relevant Orders   CBC with Differential/Platelet   Comprehensive metabolic panel   TSH   Lipid panel   VITAMIN D 25 Hydroxy (Vit-D Deficiency, Fractures)     Osteoporosis - check vitamin D.    SARA R Chrisoula Zegarra, PA-C

## 2020-03-17 LAB — COMPREHENSIVE METABOLIC PANEL
ALT: 24 IU/L (ref 0–32)
AST: 27 IU/L (ref 0–40)
Albumin/Globulin Ratio: 1.8 (ref 1.2–2.2)
Albumin: 4.5 g/dL (ref 3.8–4.9)
Alkaline Phosphatase: 54 IU/L (ref 48–121)
BUN/Creatinine Ratio: 16 (ref 9–23)
BUN: 13 mg/dL (ref 6–24)
Bilirubin Total: 0.4 mg/dL (ref 0.0–1.2)
CO2: 24 mmol/L (ref 20–29)
Calcium: 9.2 mg/dL (ref 8.7–10.2)
Chloride: 103 mmol/L (ref 96–106)
Creatinine, Ser: 0.8 mg/dL (ref 0.57–1.00)
GFR calc Af Amer: 95 mL/min/{1.73_m2} (ref >59)
GFR calc non Af Amer: 83 mL/min/{1.73_m2} (ref >59)
Globulin, Total: 2.5 g/dL (ref 1.5–4.5)
Glucose: 82 mg/dL (ref 65–99)
Potassium: 4.4 mmol/L (ref 3.5–5.2)
Sodium: 143 mmol/L (ref 134–144)
Total Protein: 7 g/dL (ref 6.0–8.5)

## 2020-03-17 LAB — CBC WITH DIFFERENTIAL/PLATELET
Basophils Absolute: 0.1 10*3/uL (ref 0.0–0.2)
Basos: 1 %
EOS (ABSOLUTE): 0.1 10*3/uL (ref 0.0–0.4)
Eos: 2 %
Hematocrit: 42.6 % (ref 34.0–46.6)
Hemoglobin: 13.8 g/dL (ref 11.1–15.9)
Immature Grans (Abs): 0 10*3/uL (ref 0.0–0.1)
Immature Granulocytes: 0 %
Lymphocytes Absolute: 2.3 10*3/uL (ref 0.7–3.1)
Lymphs: 44 %
MCH: 28.5 pg (ref 26.6–33.0)
MCHC: 32.4 g/dL (ref 31.5–35.7)
MCV: 88 fL (ref 79–97)
Monocytes Absolute: 0.5 10*3/uL (ref 0.1–0.9)
Monocytes: 9 %
Neutrophils Absolute: 2.3 10*3/uL (ref 1.4–7.0)
Neutrophils: 44 %
Platelets: 253 10*3/uL (ref 150–450)
RBC: 4.84 x10E6/uL (ref 3.77–5.28)
RDW: 13.8 % (ref 11.7–15.4)
WBC: 5.2 10*3/uL (ref 3.4–10.8)

## 2020-03-17 LAB — LIPID PANEL
Chol/HDL Ratio: 2.8 ratio (ref 0.0–4.4)
Cholesterol, Total: 199 mg/dL (ref 100–199)
HDL: 72 mg/dL (ref >39)
LDL Chol Calc (NIH): 116 mg/dL — ABNORMAL HIGH (ref 0–99)
Triglycerides: 61 mg/dL (ref 0–149)
VLDL Cholesterol Cal: 11 mg/dL (ref 5–40)

## 2020-03-17 LAB — VITAMIN D 25 HYDROXY (VIT D DEFICIENCY, FRACTURES): Vit D, 25-Hydroxy: 56.7 ng/mL (ref 30.0–100.0)

## 2020-03-17 LAB — CARDIOVASCULAR RISK ASSESSMENT

## 2020-03-17 LAB — TSH: TSH: 1.68 u[IU]/mL (ref 0.450–4.500)

## 2020-03-29 ENCOUNTER — Other Ambulatory Visit: Payer: Self-pay | Admitting: Obstetrics & Gynecology

## 2020-03-29 DIAGNOSIS — Z1231 Encounter for screening mammogram for malignant neoplasm of breast: Secondary | ICD-10-CM

## 2020-04-07 ENCOUNTER — Telehealth: Payer: Self-pay | Admitting: *Deleted

## 2020-04-07 NOTE — Telephone Encounter (Signed)
Needs to schedule annual exam

## 2020-04-09 ENCOUNTER — Other Ambulatory Visit: Payer: Self-pay

## 2020-04-09 ENCOUNTER — Ambulatory Visit
Admission: RE | Admit: 2020-04-09 | Discharge: 2020-04-09 | Disposition: A | Discharge status: Home / Self Care | Payer: BC Managed Care – PPO | Source: Ambulatory Visit | Attending: Obstetrics & Gynecology | Admitting: Obstetrics & Gynecology

## 2020-04-09 DIAGNOSIS — Z1231 Encounter for screening mammogram for malignant neoplasm of breast: Secondary | ICD-10-CM

## 2020-04-12 ENCOUNTER — Other Ambulatory Visit: Payer: Self-pay

## 2020-04-13 ENCOUNTER — Telehealth: Payer: Self-pay

## 2020-04-13 NOTE — Telephone Encounter (Signed)
Patient has been on Fosamax since 2016.  Had BD 05/07/2019 and recommended to repeat 2 years.   She questions do you want her to stay on the Fosamax. She is going to talk with PCP about refilling it as  She does not plan to come here this year. She is past due for annual exam. She said she had her physical with her PCP this year.

## 2020-04-15 NOTE — Telephone Encounter (Signed)
Recommend stopping Fosamax now and repeating a BD 05/2021.  BD 05/2019 was Osteopenia.

## 2020-04-15 NOTE — Telephone Encounter (Signed)
Per DPR access note on file detailed message left in voice mail. °

## 2020-06-15 ENCOUNTER — Ambulatory Visit: Payer: BC Managed Care – PPO | Admitting: Nurse Practitioner

## 2020-06-15 ENCOUNTER — Other Ambulatory Visit: Payer: Self-pay

## 2020-06-15 ENCOUNTER — Encounter: Payer: Self-pay | Admitting: Nurse Practitioner

## 2020-06-15 VITALS — BP 122/80

## 2020-06-15 DIAGNOSIS — N760 Acute vaginitis: Secondary | ICD-10-CM

## 2020-06-15 DIAGNOSIS — N898 Other specified noninflammatory disorders of vagina: Secondary | ICD-10-CM

## 2020-06-15 LAB — WET PREP FOR TRICH, YEAST, CLUE

## 2020-06-15 MED ORDER — HYDROCORTISONE 1 % EX OINT: 1.0000 "application " | TOPICAL_OINTMENT | Freq: Two times a day (BID) | CUTANEOUS | 0 refills | Status: DC

## 2020-06-15 NOTE — Patient Instructions (Signed)

## 2020-06-15 NOTE — Progress Notes (Signed)
   Acute Office Visit  Subjective:    Patient ID: Barri Neidlinger, female    DOB: 1966-07-21, 54 y.o.   MRN: [id number]   HPI 54 y.o. presents today for vaginal irritation, discharge, and odor that began about a week ago. The irritation is externally. She has not tried anything over the counter. Denies urinary symptoms.    Review of Systems  Constitutional: Negative.   Genitourinary: Positive for vaginal discharge and vaginal pain (itching/irritation). Negative for dysuria, frequency and urgency.       Objective:    Physical Exam Constitutional:      Appearance: Normal appearance.  Genitourinary:    Vagina: Normal.     Comments: Generalized vulvar erythema    BP 122/80 (BP Location: Right Arm, Patient Position: Sitting, Cuff Size: Normal)  Wt Readings from Last 3 Encounters:  03/16/20 147 lb (66.7 kg)  03/10/19 139 lb (63 kg)  02/20/18 144 lb (65.3 kg)   Wet prep negative      Assessment & Plan:   Problem List Items Addressed This Visit    None    Visit Diagnoses    Acute vaginitis    -  Primary   Relevant Medications   hydrocortisone 1 % ointment   Vaginal irritation       Relevant Orders   WET PREP FOR TRICH, YEAST, CLUE      Plan: Wet prep unremarkable. Exam and history consistent with acute vaginitis. May be due to menopausal vaginal dryness. Denies changes in soaps/detergents. We will try Hydrocortisone 1% ointment applied twice daily for 14 days. Keep open to air as much as possible. Return if symptoms worsen or do not improve. She is agreeable to plan.     Olivia Mackie [facility], 12:12 PM 06/15/2020

## 2020-07-19 ENCOUNTER — Other Ambulatory Visit: Payer: Self-pay | Admitting: *Deleted

## 2020-07-19 MED ORDER — ESTRADIOL 0.5 MG PO TABS: 0.5000 mg | ORAL_TABLET | Freq: Every day | ORAL | 0 refills | Status: DC

## 2020-07-19 NOTE — Telephone Encounter (Signed)
Patient annual exam scheduled on 03/17/2021. No annual exam for 2021

## 2020-09-17 ENCOUNTER — Encounter: Payer: Self-pay | Admitting: Family Medicine

## 2021-01-31 ENCOUNTER — Other Ambulatory Visit: Payer: Self-pay | Admitting: *Deleted

## 2021-01-31 MED ORDER — ESTRADIOL 0.5 MG PO TABS: 0.5000 mg | ORAL_TABLET | Freq: Every day | ORAL | 0 refills | Status: DC

## 2021-01-31 NOTE — Telephone Encounter (Signed)
Patient has annual exam scheduled on 03/15/21, requesting refill on estradiol 0.5 mg tablet. She is not out of medication, but does not have enough to last until 03/15/21. Okay to refill?

## 2021-03-11 ENCOUNTER — Other Ambulatory Visit: Payer: Self-pay | Admitting: Obstetrics & Gynecology

## 2021-03-11 DIAGNOSIS — Z1231 Encounter for screening mammogram for malignant neoplasm of breast: Secondary | ICD-10-CM

## 2021-03-15 ENCOUNTER — Ambulatory Visit (INDEPENDENT_AMBULATORY_CARE_PROVIDER_SITE_OTHER): Payer: BC Managed Care – PPO | Admitting: Nurse Practitioner

## 2021-03-15 ENCOUNTER — Encounter: Payer: Self-pay | Admitting: Nurse Practitioner

## 2021-03-15 ENCOUNTER — Other Ambulatory Visit: Payer: Self-pay

## 2021-03-15 VITALS — BP 118/74 | Ht 66.0 in | Wt 137.0 lb

## 2021-03-15 DIAGNOSIS — Z01419 Encounter for gynecological examination (general) (routine) without abnormal findings: Secondary | ICD-10-CM

## 2021-03-15 DIAGNOSIS — Z0184 Encounter for antibody response examination: Secondary | ICD-10-CM

## 2021-03-15 DIAGNOSIS — M8589 Other specified disorders of bone density and structure, multiple sites: Secondary | ICD-10-CM

## 2021-03-15 DIAGNOSIS — Z780 Asymptomatic menopausal state: Secondary | ICD-10-CM

## 2021-03-15 DIAGNOSIS — N952 Postmenopausal atrophic vaginitis: Secondary | ICD-10-CM

## 2021-03-15 DIAGNOSIS — Z79890 Hormone replacement therapy (postmenopausal): Secondary | ICD-10-CM | POA: Diagnosis not present

## 2021-03-15 DIAGNOSIS — Z8639 Personal history of other endocrine, nutritional and metabolic disease: Secondary | ICD-10-CM

## 2021-03-15 DIAGNOSIS — Z90710 Acquired absence of both cervix and uterus: Secondary | ICD-10-CM

## 2021-03-15 MED ORDER — ESTRING 2 MG VA RING: 2.0000 mg | VAGINAL_RING | VAGINAL | 3 refills | Status: DC

## 2021-03-15 NOTE — Progress Notes (Signed)
Allison Neal 1965-12-26 [id number]   History:  55 y.o. G3P3 presents for annual exam. Postmenopausal - on ERT. Denies menopausal symptoms, but does complain of vaginal dryness and pain with intercourse. 2011 TAH for fibroids. Normal pap and mammogram history. History of osteoporosis - was on Fosamax x 5 years, stopped 04/2020, most recent T-score -2.3. Would like Covid antibody testing done today.   Gynecologic History No LMP recorded. Patient has had a hysterectomy.   Contraception/Family planning: status post hysterectomy  Health Maintenance Last Pap: No longer screening per guidelines Last mammogram: 04/09/2020. Results were: Normal Last colonoscopy: 2015 Last Dexa: 05/2019. Results were: T-score -2.3, no FRAX d/t Fosamax use  Past medical history, past surgical history, family history and social history were all reviewed and documented in the EPIC chart. Married. 3 daughters, 4 grandchildren.   ROS:  A ROS was performed and pertinent positives and negatives are included.  Exam:  Vitals:   03/15/21 0804  BP: 118/74  Weight: 137 lb (62.1 kg)  Height: 5\' 6"  (1.676 m)   Body mass index is 22.11 kg/m.  General appearance:  Normal Thyroid:  Symmetrical, normal in size, without palpable masses or nodularity. Respiratory  Auscultation:  Clear without wheezing or rhonchi Cardiovascular  Auscultation:  Regular rate, without rubs, murmurs or gallops  Edema/varicosities:  Not grossly evident Abdominal  Soft,nontender, without masses, guarding or rebound.  Liver/spleen:  No organomegaly noted  Hernia:  None appreciated  Skin  Inspection:  Grossly normal Breasts: Examined lying and sitting.   Right: Without masses, retractions, nipple discharge or axillary adenopathy.   Left: Without masses, retractions, nipple discharge or axillary adenopathy. Genitourinary   Inguinal/mons:  Normal without inguinal adenopathy  External genitalia:  Normal appearing vulva with no masses,  tenderness, or lesions  BUS/Urethra/Skene's glands:  Normal  Vagina:  Normal appearing with normal color and discharge, no lesions. Atrophic changes.  Cervix:  Absent  Uterus:  Absent  Anus and perineum: Normal  Digital rectal exam: Normal sphincter tone without palpated masses or tenderness  Patient informed chaperone available to be present for breast and pelvic exam. Patient has requested no chaperone to be present. Patient has been advised what will be completed during breast and pelvic exam.    Assessment/Plan:  55 y.o. G3P3 for annual exam.   Well female exam with routine gynecological exam - Plan: CBC with Differential/Platelet, Comprehensive metabolic panel, Lipid panel. Education provided on SBEs, importance of preventative screenings, current guidelines, high calcium diet, regular exercise, and multivitamin daily.   Postmenopausal - Plan: DG Bone Density. On ERT but denies having menopausal symptoms. We discussed stopping ERT since her symptoms are only vaginal. She is agreeable.  History of total abdominal hysterectomy - 2011 for fibroids  Hormone replacement therapy - Has been on ERT but denies menopausal symptoms. Her complaints are vaginal only, so we agreed to stop ERT and try vaginal therapy.    History of vitamin D deficiency - Plan: VITAMIN D 25 Hydroxy (Vit-D Deficiency, Fractures). Taking 4000 IU daily.   Postmenopausal atrophic vaginitis - Plan: estradiol (ESTRING) 2 MG vaginal ring every 90 days. If too expensive we discussed switching to vaginal cream or tablet.   Immunity status testing - Plan: SAR CoV2 Serology (COVID 19)AB(IGG)IA  Osteopenia of multiple sites - Plan: DG Bone Density. Dexa 05/2019 T-score -2.3, no FRAX d/t Fosamax use. History of osteoporosis - was on Fosamax x 5 years, stopped 04/2020. Will repeat DXA in August.   Screening for cervical cancer -  Normal Pap history. No longer screening per guidelines.   Screening for breast cancer - Normal  mammogram history.  Continue annual screenings.  Normal breast exam today. Mammogram scheduled 05/09/2021.   Screening for colon cancer - 2015 colonoscopy. Will repeat at GI's recommended interval.   Return in 1 year for annual.    Olivia Mackie DNP, 8:30 AM 03/15/2021

## 2021-03-16 LAB — CBC WITH DIFFERENTIAL/PLATELET
Absolute Monocytes: 290 cells/uL (ref 200–950)
Basophils Absolute: 31 cells/uL (ref 0–200)
Basophils Relative: 0.7 %
Eosinophils Absolute: 40 cells/uL (ref 15–500)
Eosinophils Relative: 0.9 %
HCT: 41.8 % (ref 35.0–45.0)
Hemoglobin: 13.2 g/dL (ref 11.7–15.5)
Lymphs Abs: 1980 cells/uL (ref 850–3900)
MCH: 28.3 pg (ref 27.0–33.0)
MCHC: 31.6 g/dL — ABNORMAL LOW (ref 32.0–36.0)
MCV: 89.5 fL (ref 80.0–100.0)
MPV: 11.2 fL (ref 7.5–12.5)
Monocytes Relative: 6.6 %
Neutro Abs: 2059 cells/uL (ref 1500–7800)
Neutrophils Relative %: 46.8 %
Platelets: 255 10*3/uL (ref 140–400)
RBC: 4.67 10*6/uL (ref 3.80–5.10)
RDW: 13.2 % (ref 11.0–15.0)
Total Lymphocyte: 45 %
WBC: 4.4 10*3/uL (ref 3.8–10.8)

## 2021-03-16 LAB — LIPID PANEL
Cholesterol: 151 mg/dL (ref <200)
HDL: 59 mg/dL (ref >=50)
LDL Cholesterol (Calc): 77 mg/dL (calc)
Non-HDL Cholesterol (Calc): 92 mg/dL (calc) (ref <130)
Total CHOL/HDL Ratio: 2.6 (calc) (ref <5.0)
Triglycerides: 73 mg/dL (ref <150)

## 2021-03-16 LAB — COMPREHENSIVE METABOLIC PANEL
AG Ratio: 1.9 (calc) (ref 1.0–2.5)
ALT: 14 U/L (ref 6–29)
AST: 19 U/L (ref 10–35)
Albumin: 4.5 g/dL (ref 3.6–5.1)
Alkaline phosphatase (APISO): 63 U/L (ref 37–153)
BUN: 13 mg/dL (ref 7–25)
CO2: 28 mmol/L (ref 20–32)
Calcium: 9.1 mg/dL (ref 8.6–10.4)
Chloride: 106 mmol/L (ref 98–110)
Creat: 0.67 mg/dL (ref 0.50–1.05)
Globulin: 2.4 g/dL (calc) (ref 1.9–3.7)
Glucose, Bld: 77 mg/dL (ref 65–99)
Potassium: 4.1 mmol/L (ref 3.5–5.3)
Sodium: 141 mmol/L (ref 135–146)
Total Bilirubin: 0.6 mg/dL (ref 0.2–1.2)
Total Protein: 6.9 g/dL (ref 6.1–8.1)

## 2021-03-16 LAB — SARS-COV-2 ANTIBODY(IGG)SPIKE,SEMI-QUANTITATIVE: SARS COV1 AB(IGG)SPIKE,SEMI QN: 61.36 index — ABNORMAL HIGH (ref <1.00)

## 2021-03-16 LAB — VITAMIN D 25 HYDROXY (VIT D DEFICIENCY, FRACTURES): Vit D, 25-Hydroxy: 61 ng/mL (ref 30–100)

## 2021-03-17 ENCOUNTER — Encounter: Payer: BC Managed Care – PPO | Admitting: Nurse Practitioner

## 2021-05-09 ENCOUNTER — Ambulatory Visit: Payer: BC Managed Care – PPO

## 2021-05-10 ENCOUNTER — Ambulatory Visit
Admission: RE | Admit: 2021-05-10 | Discharge: 2021-05-10 | Disposition: A | Discharge status: Home / Self Care | Payer: BC Managed Care – PPO | Source: Ambulatory Visit | Attending: Obstetrics & Gynecology | Admitting: Obstetrics & Gynecology

## 2021-05-10 ENCOUNTER — Other Ambulatory Visit: Payer: Self-pay | Admitting: Nurse Practitioner

## 2021-05-10 ENCOUNTER — Other Ambulatory Visit: Payer: Self-pay

## 2021-05-10 ENCOUNTER — Ambulatory Visit (INDEPENDENT_AMBULATORY_CARE_PROVIDER_SITE_OTHER): Payer: BC Managed Care – PPO

## 2021-05-10 DIAGNOSIS — Z780 Asymptomatic menopausal state: Secondary | ICD-10-CM

## 2021-05-10 DIAGNOSIS — M8589 Other specified disorders of bone density and structure, multiple sites: Secondary | ICD-10-CM

## 2021-05-10 DIAGNOSIS — Z1231 Encounter for screening mammogram for malignant neoplasm of breast: Secondary | ICD-10-CM

## 2021-05-18 ENCOUNTER — Encounter: Payer: Self-pay | Admitting: Nurse Practitioner

## 2021-05-19 NOTE — Telephone Encounter (Signed)
Spoke with patient over phone.

## 2022-02-03 ENCOUNTER — Encounter: Payer: Self-pay | Admitting: Physician Assistant

## 2022-02-03 ENCOUNTER — Ambulatory Visit: Payer: BC Managed Care – PPO | Admitting: Physician Assistant

## 2022-02-03 VITALS — BP 80/60 | HR 73 | Temp 97.8°F | Resp 15 | Ht 66.0 in | Wt 144.0 lb

## 2022-02-03 DIAGNOSIS — J3489 Other specified disorders of nose and nasal sinuses: Secondary | ICD-10-CM | POA: Diagnosis not present

## 2022-02-03 NOTE — Progress Notes (Signed)
? ?Acute Office Visit ? ?Subjective:  ? ? Patient ID: Allison Neal, female    DOB: November 22, 1965, 56 y.o.   MRN: [id number] ? ?Chief Complaint  ?Patient presents with  ? Nose soreness  ?  Left nostril  ? ? ?HPI: ?Patient is in today for complaints of lesion in left nares - she noted soreness on tip of her nose that comes and goes and can feel lesion on inside of nose - says she does not think it bleeds ?Of note she has had basal cell carcinoma removed from bridge of right side of nose ? ?Past Medical History:  ?Diagnosis Date  ? Broken foot SUMMER 2011  ? RIGHT  ? Environmental allergies   ? Osteoporosis   ? Vitamin D deficiency   ? ? ?Past Surgical History:  ?Procedure Laterality Date  ? CERVICAL DISC SURGERY  2009  ? ENDOMETRIAL ABLATION  2010  ? GALLBLADDER SURGERY  2007  ? TONSILLECTOMY  2004  ? UMBILICAL HERNIA REPAIR    ? VAGINAL HYSTERECTOMY  11-25-09  ? ? ?Family History  ?Problem Relation Age of Onset  ? Diabetes Mother   ? Heart disease Mother   ? Cancer Mother   ?     ??UTERINE CANCER OR FIBROIDS??  ? Diabetes Maternal Grandmother   ? Thyroid disease Father   ? Heart disease Father   ? Cancer Father   ?     ? Testicular  ? ? ?Social History  ? ?Socioeconomic History  ? Marital status: Married  ?  Spouse name: Not on file  ? Number of children: Not on file  ? Years of education: Not on file  ? Highest education level: Not on file  ?Occupational History  ? Not on file  ?Tobacco Use  ? Smoking status: Never  ? Smokeless tobacco: Never  ?Vaping Use  ? Vaping Use: Never used  ?Substance and Sexual Activity  ? Alcohol use: No  ?  Alcohol/week: 0.0 standard drinks  ? Drug use: No  ? Sexual activity: Yes  ?  Birth control/protection: Surgical  ?  Comment: 1st intercourse 56 yo-Fewer than 5 partners  ?Other Topics Concern  ? Not on file  ?Social History Narrative  ? Not on file  ? ?Social Determinants of Health  ? ?Financial Resource Strain: Not on file  ?Food Insecurity: Not on file  ?Transportation Needs: Not on  file  ?Physical Activity: Not on file  ?Stress: Not on file  ?Social Connections: Not on file  ?Intimate Partner Violence: Not on file  ? ? ?Outpatient Medications Prior to Visit  ?Medication Sig Dispense Refill  ? calcium carbonate (OS-CAL - DOSED IN MG OF ELEMENTAL CALCIUM) 1250 (500 Ca) MG tablet Take 1 tablet by mouth.    ? cholecalciferol (VITAMIN D3) 25 MCG (1000 UNIT) tablet Take 1,000 Units by mouth daily.    ? Multiple Vitamins-Minerals (ZINC PO) Take by mouth.    ? zinc sulfate 220 (50 Zn) MG capsule Take 220 mg by mouth daily.    ? Cholecalciferol (VITAMIN D PO) Take by mouth.    ? estradiol (ESTRING) 2 MG vaginal ring Place 2 mg vaginally every 3 (three) months. follow package directions 1 each 3  ? hydrocortisone 1 % ointment Apply 1 application topically 2 (two) times daily. (Patient not taking: Reported on 03/15/2021) 30 g 0  ? ?No facility-administered medications prior to visit.  ? ? ?Allergies  ?Allergen Reactions  ? Other   ?  DUST, MOLD, CATS  ? ? ?  Review of Systems ?CONSTITUTIONAL: Negative for chills, fatigue, fever, unintentional weight gain and unintentional weight loss.  ?E/N/T: see HPI ?CARDIOVASCULAR: Negative for chest pain, ?RESPIRATORY: Negative for recent cough and dyspnea.  ? ?   ? ?   ?Objective:  ?  ?Physical Exam ?PHYSICAL EXAM:  ? ?VS: BP (!) 80/60   Pulse 73   Temp 97.8 ?F (36.6 ?C)   Resp 15   Ht 5\' 6"  (1.676 m)   Wt 144 lb (65.3 kg)   SpO2 98%   BMI 23.24 kg/m?  ? ?GEN: Well nourished, well developed, in no acute distress  ?HEENT: normal external ears and nose - normal external auditory canals and TMS  - normal nasal  mucosa shows small lesion upper tip of inside of nose - not quite on septum- Lips, Teeth and Gums - normal  ?Oropharynx - normal mucosa, palate, and posterior pharynx ? ?Cardiac: RRR; no murmurs,  ?Respiratory:  normal respiratory rate and pattern with no distress - normal breath sounds with no rales, rhonchi, wheezes or rubs ? ? ? ?Health Maintenance Due   ?Topic Date Due  ? HIV Screening  Never done  ? PAP SMEAR-Modifier  Never done  ? Zoster Vaccines- Shingrix (1 of 2) Never done  ? ? ?There are no preventive care reminders to display for this patient. ? ? ?Lab Results  ?Component Value Date  ? TSH 1.680 03/16/2020  ? ?Lab Results  ?Component Value Date  ? WBC 4.4 03/15/2021  ? HGB 13.2 03/15/2021  ? HCT 41.8 03/15/2021  ? MCV 89.5 03/15/2021  ? PLT 255 03/15/2021  ? ?Lab Results  ?Component Value Date  ? NA 141 03/15/2021  ? K 4.1 03/15/2021  ? CO2 28 03/15/2021  ? GLUCOSE 77 03/15/2021  ? BUN 13 03/15/2021  ? CREATININE 0.67 03/15/2021  ? BILITOT 0.6 03/15/2021  ? ALKPHOS 54 03/16/2020  ? AST 19 03/15/2021  ? ALT 14 03/15/2021  ? PROT 6.9 03/15/2021  ? ALBUMIN 4.5 03/16/2020  ? CALCIUM 9.1 03/15/2021  ? ?Lab Results  ?Component Value Date  ? CHOL 151 03/15/2021  ? ?Lab Results  ?Component Value Date  ? HDL 59 03/15/2021  ? ?Lab Results  ?Component Value Date  ? LDLCALC 77 03/15/2021  ? ?Lab Results  ?Component Value Date  ? TRIG 73 03/15/2021  ? ?Lab Results  ?Component Value Date  ? CHOLHDL 2.6 03/15/2021  ? ?No results found for: HGBA1C ? ?   ?Assessment & Plan:  ? ?Problem List Items Addressed This Visit   ?None ?Visit Diagnoses   ? ? Nasal lesion    -  Primary  ? Relevant Orders  ? Ambulatory referral to ENT  ? ?  ? ?No orders of the defined types were placed in this encounter. ? ? ?Orders Placed This Encounter  ?Procedures  ? Ambulatory referral to ENT  ?  ? ?Follow-up: No follow-ups on file. ? ?An After Visit Summary was printed and given to the patient. ? ?SARA R Toluwani Ruder, PA-C ?[facility] ?[phone] ?

## 2022-03-16 ENCOUNTER — Encounter: Payer: Self-pay | Admitting: Nurse Practitioner

## 2022-03-16 ENCOUNTER — Ambulatory Visit (INDEPENDENT_AMBULATORY_CARE_PROVIDER_SITE_OTHER): Payer: BC Managed Care – PPO | Admitting: Nurse Practitioner

## 2022-03-16 VITALS — BP 122/76 | Ht 66.0 in | Wt 142.0 lb

## 2022-03-16 DIAGNOSIS — N952 Postmenopausal atrophic vaginitis: Secondary | ICD-10-CM

## 2022-03-16 DIAGNOSIS — M810 Age-related osteoporosis without current pathological fracture: Secondary | ICD-10-CM | POA: Diagnosis not present

## 2022-03-16 DIAGNOSIS — E559 Vitamin D deficiency, unspecified: Secondary | ICD-10-CM

## 2022-03-16 DIAGNOSIS — Z01419 Encounter for gynecological examination (general) (routine) without abnormal findings: Secondary | ICD-10-CM

## 2022-03-16 DIAGNOSIS — Z780 Asymptomatic menopausal state: Secondary | ICD-10-CM

## 2022-03-16 DIAGNOSIS — E7800 Pure hypercholesterolemia, unspecified: Secondary | ICD-10-CM

## 2022-03-16 NOTE — Progress Notes (Signed)
Allison Neal 14-Apr-1966 [id number]   History:  56 y.o. G3P3 presents for annual exam. Postmenopausal. Complains of vaginal dryness and pain with intercourse. She was prescribed Estring but could not find at any local pharmacies. Does not want cream or vaginal tablet. S/P 2011 TAH for fibroids. Normal pap and mammogram history. History of osteoporosis - was on Fosamax x 5 years, stopped 06/2019, most recent T-score -2.2.   Gynecologic History No LMP recorded. Patient has had a hysterectomy.   Contraception/Family planning: status post hysterectomy Sexually active: Yes  Health Maintenance Last Pap: 2010. Results were: Normal Last mammogram: 05/10/2021. Results were: Normal Last colonoscopy: 01/30/2014. Results were: Normal, 10-year recall Last Dexa: 05/10/2021. Results were: T-score -2.2  Past medical history, past surgical history, family history and social history were all reviewed and documented in the EPIC chart. Married. 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 3 daughters, one works for [employer], one is Charity fundraiser but working as Product/process development scientist, one is Producer, television/film/video but working at [employer]. 5 grandchildren ranging from 69-10 yo.   ROS:  A ROS was performed and pertinent positives and negatives are included.  Exam:  Vitals:   03/16/22 0750  BP: 122/76  Weight: 142 lb (64.4 kg)  Height: 5\' 6"  (1.676 m)    Body mass index is 22.92 kg/m.  General appearance:  Normal Thyroid:  Symmetrical, normal in size, without palpable masses or nodularity. Respiratory  Auscultation:  Clear without wheezing or rhonchi Cardiovascular  Auscultation:  Regular rate, without rubs, murmurs or gallops  Edema/varicosities:  Not grossly evident Abdominal  Soft,nontender, without masses, guarding or rebound.  Liver/spleen:  No organomegaly noted  Hernia:  None appreciated  Skin  Inspection:  Grossly normal Breasts: Examined lying and sitting.   Right: Without masses, retractions, nipple discharge  or axillary adenopathy.   Left: Without masses, retractions, nipple discharge or axillary adenopathy. Genitourinary   Inguinal/mons:  Normal without inguinal adenopathy  External genitalia:  Normal appearing vulva with no masses, tenderness, or lesions  BUS/Urethra/Skene's glands:  Normal  Vagina:  Normal appearing with normal color and discharge, no lesions. Atrophic changes  Cervix:  Absent  Uterus:  Absent  Anus and perineum: Normal  Digital rectal exam: Normal sphincter tone without palpated masses or tenderness  Patient informed chaperone available to be present for breast and pelvic exam. Patient has requested no chaperone to be present. Patient has been advised what will be completed during breast and pelvic exam.    Assessment/Plan:  56 y.o. G3P3 for annual exam.   Well female exam with routine gynecological exam - Plan: CBC with Differential/Platelet, Comprehensive metabolic panel, Lipid panel. Education provided on SBEs, importance of preventative screenings, current guidelines, high calcium diet, regular exercise, and multivitamin daily.     Postmenopausal - no HRT. S/P TVH  Age-related osteoporosis without current pathological fracture - Was on Fosamax in the past, last done 06/2019. T-score -2.2 last August. Taking Vitamin D + Calcium supplements. Has not been as active since losing job. Recommend daily walking and resistance training 3-4 time per week.   History of vitamin D deficiency - Plan: VITAMIN D 25 Hydroxy (Vit-D Deficiency, Fractures). Taking 4000 IU daily.   Postmenopausal atrophic vaginitis - Complains of vaginal dryness and pain with intercourse. She was prescribed Estring but could not find at any local pharmacies. Does not want cream or vaginal tablet and wants something natural. Recommend Vitamin E or hyaluronic acid suppositories twice weekly. Also coconut oil for lubrication.  Elevated LDL cholesterol level - Plan: Lipid panel  Screening for cervical cancer  - Normal Pap history. No longer screening per guidelines.   Screening for breast cancer - Normal mammogram history.  Continue annual screenings.  Normal breast exam today.   Screening for colon cancer - 2015 colonoscopy. Will repeat at GI's recommended interval.   Return in 1 year for annual.      Olivia Mackie DNP, 8:29 AM 03/16/2022

## 2022-03-17 LAB — COMPREHENSIVE METABOLIC PANEL
AG Ratio: 1.5 (calc) (ref 1.0–2.5)
ALT: 15 U/L (ref 6–29)
AST: 19 U/L (ref 10–35)
Albumin: 4.4 g/dL (ref 3.6–5.1)
Alkaline phosphatase (APISO): 56 U/L (ref 37–153)
BUN: 18 mg/dL (ref 7–25)
CO2: 24 mmol/L (ref 20–32)
Calcium: 9.2 mg/dL (ref 8.6–10.4)
Chloride: 107 mmol/L (ref 98–110)
Creat: 0.72 mg/dL (ref 0.50–1.03)
Globulin: 3 g/dL (calc) (ref 1.9–3.7)
Glucose, Bld: 82 mg/dL (ref 65–99)
Potassium: 4.1 mmol/L (ref 3.5–5.3)
Sodium: 142 mmol/L (ref 135–146)
Total Bilirubin: 0.6 mg/dL (ref 0.2–1.2)
Total Protein: 7.4 g/dL (ref 6.1–8.1)

## 2022-03-17 LAB — LIPID PANEL
Cholesterol: 189 mg/dL (ref <200)
HDL: 68 mg/dL (ref >=50)
LDL Cholesterol (Calc): 102 mg/dL (calc) — ABNORMAL HIGH
Non-HDL Cholesterol (Calc): 121 mg/dL (calc) (ref <130)
Total CHOL/HDL Ratio: 2.8 (calc) (ref <5.0)
Triglycerides: 95 mg/dL (ref <150)

## 2022-03-17 LAB — CBC WITH DIFFERENTIAL/PLATELET
Absolute Monocytes: 350 cells/uL (ref 200–950)
Basophils Absolute: 50 cells/uL (ref 0–200)
Basophils Relative: 1 %
Eosinophils Absolute: 80 cells/uL (ref 15–500)
Eosinophils Relative: 1.6 %
HCT: 42.8 % (ref 35.0–45.0)
Hemoglobin: 14.2 g/dL (ref 11.7–15.5)
Lymphs Abs: 2010 cells/uL (ref 850–3900)
MCH: 29.4 pg (ref 27.0–33.0)
MCHC: 33.2 g/dL (ref 32.0–36.0)
MCV: 88.6 fL (ref 80.0–100.0)
MPV: 11.4 fL (ref 7.5–12.5)
Monocytes Relative: 7 %
Neutro Abs: 2510 cells/uL (ref 1500–7800)
Neutrophils Relative %: 50.2 %
Platelets: 249 10*3/uL (ref 140–400)
RBC: 4.83 10*6/uL (ref 3.80–5.10)
RDW: 13.3 % (ref 11.0–15.0)
Total Lymphocyte: 40.2 %
WBC: 5 10*3/uL (ref 3.8–10.8)

## 2022-03-17 LAB — VITAMIN D 25 HYDROXY (VIT D DEFICIENCY, FRACTURES): Vit D, 25-Hydroxy: 55 ng/mL (ref 30–100)

## 2022-03-27 ENCOUNTER — Other Ambulatory Visit: Payer: Self-pay | Admitting: Obstetrics & Gynecology

## 2022-03-27 DIAGNOSIS — Z1231 Encounter for screening mammogram for malignant neoplasm of breast: Secondary | ICD-10-CM

## 2022-05-11 ENCOUNTER — Ambulatory Visit
Admission: RE | Admit: 2022-05-11 | Discharge: 2022-05-11 | Disposition: A | Discharge status: Home / Self Care | Payer: BC Managed Care – PPO | Source: Ambulatory Visit | Attending: Obstetrics & Gynecology | Admitting: Obstetrics & Gynecology

## 2022-05-11 DIAGNOSIS — Z1231 Encounter for screening mammogram for malignant neoplasm of breast: Secondary | ICD-10-CM

## 2022-11-28 ENCOUNTER — Encounter: Payer: Self-pay | Admitting: Physician Assistant

## 2022-11-28 ENCOUNTER — Ambulatory Visit: Payer: BC Managed Care – PPO | Admitting: Physician Assistant

## 2022-11-28 VITALS — BP 114/70 | HR 91 | Temp 97.1°F | Ht 66.0 in | Wt 150.4 lb

## 2022-11-28 DIAGNOSIS — N3001 Acute cystitis with hematuria: Secondary | ICD-10-CM

## 2022-11-28 LAB — POCT URINALYSIS DIP (CLINITEK)
Bilirubin, UA: NEGATIVE
Glucose, UA: NEGATIVE mg/dL
Ketones, POC UA: NEGATIVE mg/dL
Nitrite, UA: NEGATIVE
POC PROTEIN,UA: 100 — AB
Spec Grav, UA: 1.02 (ref 1.010–1.025)
Urobilinogen, UA: 0.2 E.U./dL
pH, UA: 5.5 (ref 5.0–8.0)

## 2022-11-28 MED ORDER — NITROFURANTOIN MONOHYD MACRO 100 MG PO CAPS: 100.0000 mg | ORAL_CAPSULE | Freq: Two times a day (BID) | ORAL | 0 refills | Status: DC

## 2022-11-28 MED ORDER — PHENAZOPYRIDINE HCL 200 MG PO TABS: 200.0000 mg | ORAL_TABLET | Freq: Three times a day (TID) | ORAL | 0 refills | Status: DC | PRN

## 2022-11-28 NOTE — Progress Notes (Signed)
Acute Office Visit  Subjective:    Patient ID: Allison Neal, female    DOB: 1966-07-19, 57 y.o.   MRN: [id number]  Chief Complaint  Patient presents with   Urinary Frequency    HPI: Patient is in today for complaints of urine frequency, urgency and dysuria for the past several days.  Denies nausea, vomiting or fever.  Denies gross hematuria.  Last UTI was several years ago.  Past Medical History:  Diagnosis Date   Broken foot SUMMER 2011   RIGHT   Environmental allergies    Osteoporosis    Vitamin D deficiency     Past Surgical History:  Procedure Laterality Date   CERVICAL DISC SURGERY  2009   ENDOMETRIAL ABLATION  2010   GALLBLADDER SURGERY  2007   TONSILLECTOMY  [id number]   UMBILICAL HERNIA REPAIR     VAGINAL HYSTERECTOMY  11-25-09    Family History  Problem Relation Age of Onset   Diabetes Mother    Heart disease Mother    Cancer Mother        ??UTERINE CANCER OR FIBROIDS??   Diabetes Maternal Grandmother    Thyroid disease Father    Heart disease Father    Cancer Father        ? Testicular    Social History   Socioeconomic History   Marital status: Married    Spouse name: Not on file   Number of children: Not on file   Years of education: Not on file   Highest education level: Not on file  Occupational History   Not on file  Tobacco Use   Smoking status: Never   Smokeless tobacco: Never  Vaping Use   Vaping Use: Never used  Substance and Sexual Activity   Alcohol use: No    Alcohol/week: 0.0 standard drinks of alcohol   Drug use: No   Sexual activity: Yes    Birth control/protection: Surgical    Comment: 1st intercourse 57 yo-Fewer than 5 partners  Other Topics Concern   Not on file  Social History Narrative   Not on file   Social Determinants of Health   Financial Resource Strain: Not on file  Food Insecurity: Not on file  Transportation Needs: Not on file  Physical Activity: Not on file  Stress: Not on file  Social Connections: Not  on file  Intimate Partner Violence: Not on file    Outpatient Medications Prior to Visit  Medication Sig Dispense Refill   calcium carbonate (OS-CAL - DOSED IN MG OF ELEMENTAL CALCIUM) 1250 (500 Ca) MG tablet Take 1 tablet by mouth.     cholecalciferol (VITAMIN D3) 25 MCG (1000 UNIT) tablet Take 1,000 Units by mouth daily.     Menaquinone-7 (VITAMIN K2 PO) Take by mouth.     zinc sulfate 220 (50 Zn) MG capsule Take 220 mg by mouth daily.     No facility-administered medications prior to visit.    Allergies  Allergen Reactions   Other     DUST, MOLD, CATS    Review of Systems CONSTITUTIONAL: Negative for chills, fatigue, fever,  CARDIOVASCULAR: Negative for chest pain,  RESPIRATORY: Negative for recent cough and dyspnea.  GASTROINTESTINAL: Negative for abdominal pain, acid reflux symptoms, constipation, diarrhea, nausea and vomiting.  GU- see HPI     Objective:  PHYSICAL EXAM:   VS: BP 114/70 (BP Location: Left Arm, Patient Position: Sitting, Cuff Size: Normal)   Pulse 91   Temp (!) 97.1 F (36.2 C) (Temporal)  Ht '5\' 6"'$  (1.676 m)   Wt 150 lb 6.4 oz (68.2 kg)   SpO2 96%   BMI 24.28 kg/m   GEN: Well nourished, well developed, in no acute distress  Cardiac: RRR; no murmurs,  Respiratory:  normal respiratory rate and pattern with no distress - normal breath sounds with no rales, rhonchi, wheezes or rubs Psych: euthymic mood, appropriate affect and demeanor  Office Visit on 11/28/2022  Component Date Value Ref Range Status   Color, UA 11/28/2022 yellow  yellow Final   Clarity, UA 11/28/2022 cloudy (A)  clear Final   Glucose, UA 11/28/2022 negative  negative mg/dL Final   Bilirubin, UA 11/28/2022 negative  negative Final   Ketones, POC UA 11/28/2022 negative  negative mg/dL Final   Spec Grav, UA 11/28/2022 1.020  1.010 - 1.025 Final   Blood, UA 11/28/2022 large (A)  negative Final   pH, UA 11/28/2022 5.5  5.0 - 8.0 Final   POC PROTEIN,UA 11/28/2022 =100 (A)   negative, trace Final   Urobilinogen, UA 11/28/2022 0.2  0.2 or 1.0 E.U./dL Final   Nitrite, UA 11/28/2022 Negative  Negative Final   Leukocytes, UA 11/28/2022 Large (3+) (A)  Negative Final    Health Maintenance Due  Topic Date Due   HIV Screening  Never done   PAP SMEAR-Modifier  Never done   Zoster Vaccines- Shingrix (1 of 2) Never done   INFLUENZA VACCINE  Never done    There are no preventive care reminders to display for this patient.   Lab Results  Component Value Date   TSH 1.680 03/16/2020   Lab Results  Component Value Date   WBC 5.0 03/16/2022   HGB 14.2 03/16/2022   HCT 42.8 03/16/2022   MCV 88.6 03/16/2022   PLT 249 03/16/2022   Lab Results  Component Value Date   NA 142 03/16/2022   K 4.1 03/16/2022   CO2 24 03/16/2022   GLUCOSE 82 03/16/2022   BUN 18 03/16/2022   CREATININE 0.72 03/16/2022   BILITOT 0.6 03/16/2022   ALKPHOS 54 03/16/2020   AST 19 03/16/2022   ALT 15 03/16/2022   PROT 7.4 03/16/2022   ALBUMIN 4.5 03/16/2020   CALCIUM 9.2 03/16/2022   Lab Results  Component Value Date   CHOL 189 03/16/2022   Lab Results  Component Value Date   HDL 68 03/16/2022   Lab Results  Component Value Date   LDLCALC 102 (H) 03/16/2022   Lab Results  Component Value Date   TRIG 95 03/16/2022   Lab Results  Component Value Date   CHOLHDL 2.8 03/16/2022   No results found for: "HGBA1C"     Assessment & Plan:  Acute cystitis with hematuria    Meds ordered this encounter  Medications   nitrofurantoin, macrocrystal-monohydrate, (MACROBID) 100 MG capsule    Sig: Take 1 capsule (100 mg total) by mouth 2 (two) times daily.    Dispense:  20 capsule    Refill:  0    Order Specific Question:   Supervising Provider    Answer:   Shelton Silvas   phenazopyridine (PYRIDIUM) 200 MG tablet    Sig: Take 1 tablet (200 mg total) by mouth 3 (three) times daily as needed for pain.    Dispense:  10 tablet    Refill:  0    Order Specific Question:    Supervising Provider    AnswerShelton Silvas    Orders Placed This Encounter  Procedures  Urine Culture   POCT URINALYSIS DIP (CLINITEK)     Follow-up: Return in about 3 weeks (around 12/19/2022), or if symptoms worsen or fail to improve, for repat ua - nurse visit.  An After Visit Summary was printed and given to the patient.  Yetta Flock [facility] [phone]

## 2022-12-02 LAB — URINE CULTURE

## 2022-12-19 ENCOUNTER — Ambulatory Visit (INDEPENDENT_AMBULATORY_CARE_PROVIDER_SITE_OTHER): Payer: BC Managed Care – PPO

## 2022-12-19 ENCOUNTER — Other Ambulatory Visit: Payer: Self-pay | Admitting: Physician Assistant

## 2022-12-19 DIAGNOSIS — N3000 Acute cystitis without hematuria: Secondary | ICD-10-CM | POA: Diagnosis not present

## 2022-12-19 LAB — POCT URINALYSIS DIP (CLINITEK)
Bilirubin, UA: NEGATIVE
Blood, UA: NEGATIVE
Glucose, UA: NEGATIVE mg/dL
Ketones, POC UA: NEGATIVE mg/dL
Nitrite, UA: NEGATIVE
POC PROTEIN,UA: NEGATIVE
Spec Grav, UA: <=1.005 — AB (ref 1.010–1.025)
Urobilinogen, UA: 0.2 E.U./dL
pH, UA: 6 (ref 5.0–8.0)

## 2022-12-19 MED ORDER — CIPROFLOXACIN HCL 500 MG PO TABS: 500.0000 mg | ORAL_TABLET | Freq: Two times a day (BID) | ORAL | 0 refills | Status: AC

## 2022-12-19 NOTE — Progress Notes (Signed)
Patient came in today for repeat UA. Patient stated she is having some frequent urination no other symptoms.

## 2022-12-19 NOTE — Addendum Note (Signed)
Addended by: Burna Forts on: 12/19/2022 03:32 PM   Modules accepted: Orders

## 2022-12-20 LAB — URINE CULTURE: Organism ID, Bacteria: NO GROWTH

## 2022-12-27 ENCOUNTER — Encounter: Payer: Self-pay | Admitting: Podiatry

## 2022-12-27 ENCOUNTER — Ambulatory Visit (INDEPENDENT_AMBULATORY_CARE_PROVIDER_SITE_OTHER): Payer: BC Managed Care – PPO

## 2022-12-27 ENCOUNTER — Ambulatory Visit: Payer: BC Managed Care – PPO | Admitting: Podiatry

## 2022-12-27 DIAGNOSIS — M722 Plantar fascial fibromatosis: Secondary | ICD-10-CM

## 2022-12-27 DIAGNOSIS — R52 Pain, unspecified: Secondary | ICD-10-CM | POA: Diagnosis not present

## 2022-12-27 MED ORDER — MELOXICAM 15 MG PO TABS: 15.0000 mg | ORAL_TABLET | Freq: Every day | ORAL | 0 refills | Status: DC

## 2022-12-27 NOTE — Patient Instructions (Signed)

## 2022-12-27 NOTE — Progress Notes (Signed)
  Subjective:  Patient ID: Allison Neal, female    DOB: 08-Dec-1965,   MRN: [id number]  Chief Complaint  Patient presents with   Foot Problem    Bottom of R foot has a lump thats painful, arch, has been ongoing for a month, intermittent pain     57 y.o. female presents for concern as above. Relates about a month ago she noticed a lump on the bottom of her right foot. Relates its ok if not on her feet much but when working on her feet it is very painful. Denies any treatments . Denies any other pedal complaints. Denies n/v/f/c.   Past Medical History:  Diagnosis Date   Broken foot SUMMER 2011   RIGHT   Environmental allergies    Osteoporosis    Vitamin D deficiency     Objective:  Physical Exam: Vascular: DP/PT pulses 2/4 bilateral. CFT <3 seconds. Normal hair growth on digits. No edema.  Skin. No lacerations or abrasions bilateral feet. 1 cm nodule noted to plantar medial fascia. Mobile with fascia. Firm and tender to palaption.  Musculoskeletal: MMT 5/5 bilateral lower extremities in DF, PF, Inversion and Eversion. Deceased ROM in DF of ankle joint.  Neurological: Sensation intact to light touch.   Assessment:   1. Plantar fascial fibromatosis of right foot      Plan:  Patient was evaluated and treated and all questions answered. X-rays reviewed and discussed with patient. No acute fractures or dislocations noted Discussed plantar fibroma with patient.  Discussed treatment options including, ice, NSAIDS, supportive shoes, bracing, and stretching. Stretching exercises provided to be done on a daily basis.   Prescription for meloxicam provided and sent to pharmacy. Offloading padding provided.   Follow-up as needed.    Lorenda Peck, DPM

## 2023-03-29 ENCOUNTER — Encounter: Payer: Self-pay | Admitting: Obstetrics & Gynecology

## 2023-03-29 ENCOUNTER — Other Ambulatory Visit (HOSPITAL_COMMUNITY)
Admission: RE | Admit: 2023-03-29 | Discharge: 2023-03-29 | Disposition: A | Discharge status: Home / Self Care | Payer: BC Managed Care – PPO | Source: Ambulatory Visit | Attending: Obstetrics & Gynecology | Admitting: Obstetrics & Gynecology

## 2023-03-29 ENCOUNTER — Ambulatory Visit (INDEPENDENT_AMBULATORY_CARE_PROVIDER_SITE_OTHER): Payer: BC Managed Care – PPO | Admitting: Obstetrics & Gynecology

## 2023-03-29 VITALS — BP 120/84 | HR 59 | Ht 66.5 in | Wt 147.0 lb

## 2023-03-29 DIAGNOSIS — N958 Other specified menopausal and perimenopausal disorders: Secondary | ICD-10-CM

## 2023-03-29 DIAGNOSIS — M810 Age-related osteoporosis without current pathological fracture: Secondary | ICD-10-CM | POA: Diagnosis not present

## 2023-03-29 DIAGNOSIS — Z780 Asymptomatic menopausal state: Secondary | ICD-10-CM

## 2023-03-29 DIAGNOSIS — N952 Postmenopausal atrophic vaginitis: Secondary | ICD-10-CM

## 2023-03-29 DIAGNOSIS — Z01419 Encounter for gynecological examination (general) (routine) without abnormal findings: Secondary | ICD-10-CM | POA: Diagnosis present

## 2023-03-29 NOTE — Progress Notes (Signed)
Allison Neal [birth year] [id number]   History:    57 y.o.   RP:  Established patient presenting for annual gyn exam   HPI: Tiffany Wallace's patient.  S/P Total Hysterectomy, patho benign.  Postmenopause, well on no HRT.  C/O dryness/discomfort with IC.  Recommend Replens.  Declines vaginal Estradiol at this time.  Pap Neg in around 2010.  Pap reflex today.  Breasts normal.  Mammo Neg 05/2022.  Bone Density Osteopenia T-Score -2.2 in 05/2021. [facility]  Colono 01/2014.  BMI 23.37. Health labs here today.  Past medical history,surgical history, family history and social history were all reviewed and documented in the EPIC chart.  Gynecologic History No LMP recorded. Patient has had a hysterectomy.  Obstetric History OB History  Gravida Para Term Preterm AB Living  3 3       3   SAB IAB Ectopic Multiple Live Births               # Outcome Date GA Lbr Len/2nd Weight Sex Delivery Anes PTL Lv  3 Para           2 Para           1 Para              ROS: A ROS was performed and pertinent positives and negatives are included in the history. GENERAL: No fevers or chills. HEENT: No change in vision, no earache, sore throat or sinus congestion. NECK: No pain or stiffness. CARDIOVASCULAR: No chest pain or pressure. No palpitations. PULMONARY: No shortness of breath, cough or wheeze. GASTROINTESTINAL: No abdominal pain, nausea, vomiting or diarrhea, melena or bright red blood per rectum. GENITOURINARY: No urinary frequency, urgency, hesitancy or dysuria. MUSCULOSKELETAL: No joint or muscle pain, no back pain, no recent trauma. DERMATOLOGIC: No rash, no itching, no lesions. ENDOCRINE: No polyuria, polydipsia, no heat or cold intolerance. No recent change in weight. HEMATOLOGICAL: No anemia or easy bruising or bleeding. NEUROLOGIC: No headache, seizures, numbness, tingling or weakness. PSYCHIATRIC: No depression, no loss of interest in normal activity or change in sleep  pattern.     Exam:   BP 120/84 (BP Location: Left Arm, Patient Position: Sitting, Cuff Size: Normal)   Pulse (!) 59   Ht 5' 6.5" (1.689 m)   Wt 147 lb (66.7 kg)   SpO2 94%   BMI 23.37 kg/m   Body mass index is 23.37 kg/m.  General appearance : Well developed well nourished female. No acute distress HEENT: Eyes: no retinal hemorrhage or exudates,  Neck supple, trachea midline, no carotid bruits, no thyroidmegaly Lungs: Clear to auscultation, no rhonchi or wheezes, or rib retractions  Heart: Regular rate and rhythm, no murmurs or gallops Breast:Examined in sitting and supine position were symmetrical in appearance, no palpable masses or tenderness,  no skin retraction, no nipple inversion, no nipple discharge, no skin discoloration, no axillary or supraclavicular lymphadenopathy Abdomen: no palpable masses or tenderness, no rebound or guarding Extremities: no edema or skin discoloration or tenderness  Pelvic: Vulva: Normal             Vagina: No gross lesions or discharge.  Pap reflex done.  Cervix/Uterus absent  Adnexa  Without masses or tenderness  Anus: Normal   Assessment/Plan:  57 y.o. female for annual exam   1. Encounter for gynecological examination (general) (routine) without abnormal findings Tiffany Wallace's patient.  S/P Total Hysterectomy, patho benign.  Postmenopause, well on no HRT.  C/O dryness/discomfort  with IC.  Recommend Replens.  Declines vaginal Estradiol at this time.  Pap Neg in around 2010.  Pap reflex today.  Breasts normal.  Mammo Neg 05/2022.  Bone Density Osteopenia T-Score -2.2 in 05/2021. [facility]  Colono 01/2014.  BMI 23.37. Health labs here today. - Cytology - PAP( [facility]) - CBC - Comp Met (CMET) - Lipid Profile - TSH - Vitamin D (25 hydroxy)  2. Postmenopausal S/P Total Hysterectomy, patho benign.  Postmenopause, well on no HRT.    3. Genitourinary syndrome of menopause C/O dryness/discomfort with IC.   Recommend Replens.  Declines vaginal Estradiol at this time.   4. Age-related osteoporosis without current pathological fracture Bone Density Osteopenia T-Score -2.2 in 05/2021. [facility]  - DG Bone Density; Future   Genia Del MD, 10:31 AM

## 2023-03-30 LAB — CBC
HCT: 43.5 % (ref 35.0–45.0)
Hemoglobin: 14 g/dL (ref 11.7–15.5)
MCH: 28.5 pg (ref 27.0–33.0)
MCHC: 32.2 g/dL (ref 32.0–36.0)
MCV: 88.6 fL (ref 80.0–100.0)
MPV: 11.3 fL (ref 7.5–12.5)
Platelets: 257 10*3/uL (ref 140–400)
RBC: 4.91 10*6/uL (ref 3.80–5.10)
RDW: 13.6 % (ref 11.0–15.0)
WBC: 5.2 10*3/uL (ref 3.8–10.8)

## 2023-03-30 LAB — TSH: TSH: 1.72 mIU/L (ref 0.40–4.50)

## 2023-03-30 LAB — LIPID PANEL
Cholesterol: 208 mg/dL — ABNORMAL HIGH (ref <200)
HDL: 75 mg/dL (ref >=50)
LDL Cholesterol (Calc): 113 mg/dL (calc) — ABNORMAL HIGH
Non-HDL Cholesterol (Calc): 133 mg/dL (calc) — ABNORMAL HIGH (ref <130)
Total CHOL/HDL Ratio: 2.8 (calc) (ref <5.0)
Triglycerides: 96 mg/dL (ref <150)

## 2023-03-30 LAB — COMPREHENSIVE METABOLIC PANEL
AG Ratio: 1.8 (calc) (ref 1.0–2.5)
ALT: 24 U/L (ref 6–29)
AST: 24 U/L (ref 10–35)
Albumin: 4.7 g/dL (ref 3.6–5.1)
Alkaline phosphatase (APISO): 60 U/L (ref 37–153)
BUN: 14 mg/dL (ref 7–25)
CO2: 26 mmol/L (ref 20–32)
Calcium: 9.7 mg/dL (ref 8.6–10.4)
Chloride: 105 mmol/L (ref 98–110)
Creat: 0.7 mg/dL (ref 0.50–1.03)
Globulin: 2.6 g/dL (calc) (ref 1.9–3.7)
Glucose, Bld: 87 mg/dL (ref 65–99)
Potassium: 4.2 mmol/L (ref 3.5–5.3)
Sodium: 141 mmol/L (ref 135–146)
Total Bilirubin: 0.6 mg/dL (ref 0.2–1.2)
Total Protein: 7.3 g/dL (ref 6.1–8.1)

## 2023-03-30 LAB — VITAMIN D 25 HYDROXY (VIT D DEFICIENCY, FRACTURES): Vit D, 25-Hydroxy: 41 ng/mL (ref 30–100)

## 2023-04-02 LAB — CYTOLOGY - PAP: Diagnosis: NEGATIVE

## 2023-04-10 ENCOUNTER — Other Ambulatory Visit: Payer: Self-pay | Admitting: Obstetrics & Gynecology

## 2023-04-10 DIAGNOSIS — Z1231 Encounter for screening mammogram for malignant neoplasm of breast: Secondary | ICD-10-CM

## 2023-10-12 ENCOUNTER — Ambulatory Visit
Admission: RE | Admit: 2023-10-12 | Discharge: 2023-10-12 | Disposition: A | Discharge status: Home / Self Care | Payer: 59 | Source: Ambulatory Visit | Attending: Obstetrics & Gynecology | Admitting: Obstetrics & Gynecology

## 2023-10-12 DIAGNOSIS — M810 Age-related osteoporosis without current pathological fracture: Secondary | ICD-10-CM

## 2023-10-12 DIAGNOSIS — Z1231 Encounter for screening mammogram for malignant neoplasm of breast: Secondary | ICD-10-CM

## 2024-03-31 NOTE — Progress Notes (Signed)
 58 y.o. G75P3003 female with history of osteoporosis (completed Fosamax  for 5 years, discontinued 2021), s/p hysterectomy here for annual exam. Married. Lexicographer.  No LMP recorded. Patient has had a hysterectomy.   She reports no concerns today.  Clemens last month and noticing some mild right hip and groin pain.  Abnormal bleeding: None Pelvic discharge or pain: None Breast mass, nipple discharge or skin changes : None  Sexually active: Yes Birth control: None Last PAP:     Component Value Date/Time   DIAGPAP  03/29/2023 1047    - Negative for intraepithelial lesion or malignancy (NILM)   ADEQPAP Satisfactory for evaluation. 03/29/2023 1047   Last mammogram: 10/12/23 BI-RADS 1 Last colonoscopy: 01/30/14 1 yr recall, had vasovagal while completing prep.  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 in [facility]  PHQ-2 Total Score 0      GYN HISTORY: No significant history  OB History  Gravida Para Term Preterm AB Living  3 3 3   3   SAB IAB Ectopic Multiple Live Births      3    # Outcome Date GA Lbr Len/2nd Weight Sex Type Anes PTL Lv  3 Term      Vag-Spont   LIV  2 Term      Vag-Spont   LIV  1 Term      Vag-Spont   LIV   Past Medical History:  Diagnosis Date   Broken foot SUMMER 2011   RIGHT   Environmental allergies    Osteoporosis    Vitamin D  deficiency    Past Surgical History:  Procedure Laterality Date   CERVICAL DISC SURGERY  2009   ENDOMETRIAL ABLATION  2010   GALLBLADDER SURGERY  2007   TONSILLECTOMY  2004   UMBILICAL HERNIA REPAIR     VAGINAL HYSTERECTOMY  11-25-09   No current outpatient medications on file prior to visit.   No current facility-administered medications on file prior to visit.   Social History   Socioeconomic History   Marital status: Married    Spouse name: Not on file   Number of children: Not on  file   Years of education: Not on file   Highest education level: Not on file  Occupational History   Not on file  Tobacco Use   Smoking status: Never   Smokeless tobacco: Never  Vaping Use   Vaping status: Never Used  Substance and Sexual Activity   Alcohol use: No    Alcohol/week: 0.0 standard drinks of alcohol   Drug use: No   Sexual activity: Yes    Birth control/protection: Surgical    Comment: 1st intercourse 58 yo-Fewer than 5 partners  Other Topics Concern   Not on file  Social History Narrative   Not on file   Social Drivers of Health   Financial Resource Strain: Not on file  Food Insecurity: Not on file  Transportation Needs: Not on file  Physical Activity: Not on file  Stress: Not on file  Social Connections: Not on file  Intimate Partner Violence: Not on file   Family History  Problem Relation Age of Onset   Diabetes Mother    Heart disease Mother    Cancer Mother        ??UTERINE CANCER OR FIBROIDS??   Diabetes Maternal Grandmother    Thyroid disease Father    Heart disease Father  Cancer Father        ? Testicular   Allergies  Allergen Reactions   Other     DUST, MOLD, CATS     PE Today's Vitals   04/01/24 0805  BP: 104/72  Pulse: 64  Temp: 97.9 F (36.6 C)  TempSrc: Oral  SpO2: 99%  Weight: 150 lb (68 kg)  Height: 5' 6.25 (1.683 m)   Body mass index is 24.03 kg/m.  Physical Exam Vitals reviewed. Exam conducted with a chaperone present.  Constitutional:      General: She is not in acute distress.    Appearance: Normal appearance.  HENT:     Head: Normocephalic and atraumatic.     Nose: Nose normal.   Eyes:     Extraocular Movements: Extraocular movements intact.     Conjunctiva/sclera: Conjunctivae normal.   Neck:     Thyroid: No thyroid mass, thyromegaly or thyroid tenderness.  Pulmonary:     Effort: Pulmonary effort is normal.  Chest:     Chest wall: No mass or tenderness.  Breasts:    Right: Normal. No swelling,  mass, nipple discharge or tenderness.     Left: Normal. No swelling, mass, nipple discharge or tenderness.  Abdominal:     General: There is no distension.     Palpations: Abdomen is soft.     Tenderness: There is no abdominal tenderness.  Genitourinary:    General: Normal vulva.     Exam position: Lithotomy position.     Urethra: No prolapse.     Vagina: Normal. No vaginal discharge or bleeding.     Cervix: No lesion.     Adnexa: Right adnexa normal and left adnexa normal.     Comments: Cervix and uterus absent  Musculoskeletal:        General: Normal range of motion.     Cervical back: Normal range of motion.  Lymphadenopathy:     Upper Body:     Right upper body: No axillary adenopathy.     Left upper body: No axillary adenopathy.     Lower Body: No right inguinal adenopathy. No left inguinal adenopathy.   Skin:    General: Skin is warm and dry.   Neurological:     General: No focal deficit present.     Mental Status: She is alert.   Psychiatric:        Mood and Affect: Mood normal.        Behavior: Behavior normal.       Assessment and Plan:        Well woman exam with routine gynecological exam Assessment & Plan: Cervical cancer screening N/A- hysterectomy Encouraged annual mammogram screening Colonoscopy due, referred DXA UTD Labs and immunizations with her primary Encouraged safe sexual practices as indicated Encouraged healthy lifestyle practices with diet and exercise For patients under 50-70yo, I recommend 1200mg  calcium daily and 600IU of vitamin D  daily. Recommend PT for hip and groin pain following fall. Declines at this time. Recommend at home stretching.  Orders: -     VITAMIN D  25 Hydroxy (Vit-D Deficiency, Fractures) -     Thyroid Panel With TSH -     CBC -     COMPLETE METABOLIC PANEL WITHOUT GFR -     Lipid panel  Age-related osteoporosis without current pathological fracture Assessment & Plan: Personal and family history of  fracture Bone density report discussed.  Risk factors assessed: Menopause Counseled regarding osteoporosis, risk factors, treatment options. Discussed importance of weight bearing  exercise, daily calcium 1200 mg daily and vit D, and fall risk reduction. Recommend prolia given completion of 57yr of fosamax  Prolia 60mg  SQ injection (upper arm, thigh, or the abdomen) q6 months for up to 10y.  Risk include hypocalcemia, musculoskeletal pain, rapid bone loss, osteonecrosis of the jaw, and increased fracture risk following discontinuation of medication.   Relative contraindications: patients with advanced stage kidney disease are at risk of hypocalcemia. Requires calcium within 10d of administration. Ensure dental checks, calcium and vitamin D  supplementation. DXA q2y. Transition to bisphosphonate 6 month after last dose.  Restart Fosamax  if Prolia is not covered.   Orders: -     VITAMIN D  25 Hydroxy (Vit-D Deficiency, Fractures) -     COMPLETE METABOLIC PANEL WITHOUT GFR  Colon cancer screening -     Ambulatory referral to Gastroenterology   Vera LULLA Pa, MD

## 2024-04-01 ENCOUNTER — Encounter: Payer: Self-pay | Admitting: Obstetrics and Gynecology

## 2024-04-01 ENCOUNTER — Ambulatory Visit (INDEPENDENT_AMBULATORY_CARE_PROVIDER_SITE_OTHER): Admitting: Obstetrics and Gynecology

## 2024-04-01 VITALS — BP 104/72 | HR 64 | Temp 97.9°F | Ht 66.25 in | Wt 150.0 lb

## 2024-04-01 DIAGNOSIS — M810 Age-related osteoporosis without current pathological fracture: Secondary | ICD-10-CM | POA: Diagnosis not present

## 2024-04-01 DIAGNOSIS — Z1211 Encounter for screening for malignant neoplasm of colon: Secondary | ICD-10-CM

## 2024-04-01 DIAGNOSIS — Z01419 Encounter for gynecological examination (general) (routine) without abnormal findings: Secondary | ICD-10-CM | POA: Diagnosis not present

## 2024-04-01 DIAGNOSIS — Z1331 Encounter for screening for depression: Secondary | ICD-10-CM

## 2024-04-01 NOTE — Assessment & Plan Note (Signed)
 Cervical cancer screening N/A- hysterectomy Encouraged annual mammogram screening Colonoscopy due, referred DXA UTD Labs and immunizations with her primary Encouraged safe sexual practices as indicated Encouraged healthy lifestyle practices with diet and exercise For patients under 50-60yo, I recommend 1200mg  calcium daily and 600IU of vitamin D  daily.

## 2024-04-01 NOTE — Assessment & Plan Note (Addendum)
 Personal and family history of fracture Bone density report discussed.  Risk factors assessed: Menopause Counseled regarding osteoporosis, risk factors, treatment options. Discussed importance of weight bearing exercise, daily calcium 1200 mg daily and vit D, and fall risk reduction. Recommend prolia given completion of 10yr of fosamax  Prolia 60mg  SQ injection (upper arm, thigh, or the abdomen) q6 months for up to 10y.  Risk include hypocalcemia, musculoskeletal pain, rapid bone loss, osteonecrosis of the jaw, and increased fracture risk following discontinuation of medication.   Relative contraindications: patients with advanced stage kidney disease are at risk of hypocalcemia. Requires calcium within 10d of administration. Ensure dental checks, calcium and vitamin D  supplementation. DXA q2y. Transition to bisphosphonate 6 month after last dose.  Restart Fosamax  if Prolia is not covered.

## 2024-04-01 NOTE — Patient Instructions (Signed)

## 2024-04-02 ENCOUNTER — Ambulatory Visit: Payer: Self-pay | Admitting: Obstetrics and Gynecology

## 2024-04-02 DIAGNOSIS — E785 Hyperlipidemia, unspecified: Secondary | ICD-10-CM | POA: Insufficient documentation

## 2024-04-02 DIAGNOSIS — E7800 Pure hypercholesterolemia, unspecified: Secondary | ICD-10-CM

## 2024-04-02 LAB — COMPLETE METABOLIC PANEL WITHOUT GFR
AG Ratio: 1.6 (calc) (ref 1.0–2.5)
ALT: 23 U/L (ref 6–29)
AST: 25 U/L (ref 10–35)
Albumin: 4.6 g/dL (ref 3.6–5.1)
Alkaline phosphatase (APISO): 63 U/L (ref 37–153)
BUN: 15 mg/dL (ref 7–25)
CO2: 26 mmol/L (ref 20–32)
Calcium: 9.5 mg/dL (ref 8.6–10.4)
Chloride: 106 mmol/L (ref 98–110)
Creat: 0.63 mg/dL (ref 0.50–1.05)
Globulin: 2.9 g/dL (ref 1.9–3.7)
Glucose, Bld: 85 mg/dL (ref 65–99)
Potassium: 4.7 mmol/L (ref 3.5–5.3)
Sodium: 141 mmol/L (ref 135–146)
Total Bilirubin: 0.7 mg/dL (ref 0.2–1.2)
Total Protein: 7.5 g/dL (ref 6.1–8.1)

## 2024-04-02 LAB — LIPID PANEL
Cholesterol: 200 mg/dL — ABNORMAL HIGH (ref <200)
HDL: 67 mg/dL (ref >=50)
LDL Cholesterol (Calc): 112 mg/dL — ABNORMAL HIGH
Non-HDL Cholesterol (Calc): 133 mg/dL — ABNORMAL HIGH (ref <130)
Total CHOL/HDL Ratio: 3 (calc) (ref <5.0)
Triglycerides: 99 mg/dL (ref <150)

## 2024-04-02 LAB — THYROID PANEL WITH TSH
Free Thyroxine Index: 2.1 (ref 1.4–3.8)
T3 Uptake: 30 % (ref 22–35)
T4, Total: 7 ug/dL (ref 5.1–11.9)
TSH: 1.86 m[IU]/L (ref 0.40–4.50)

## 2024-04-02 LAB — CBC
HCT: 45.6 % — ABNORMAL HIGH (ref 35.0–45.0)
Hemoglobin: 14.5 g/dL (ref 11.7–15.5)
MCH: 28.4 pg (ref 27.0–33.0)
MCHC: 31.8 g/dL — ABNORMAL LOW (ref 32.0–36.0)
MCV: 89.4 fL (ref 80.0–100.0)
MPV: 11.3 fL (ref 7.5–12.5)
Platelets: 277 10*3/uL (ref 140–400)
RBC: 5.1 Million/uL (ref 3.80–5.10)
RDW: 13.2 % (ref 11.0–15.0)
WBC: 5.3 10*3/uL (ref 3.8–10.8)

## 2024-04-02 LAB — VITAMIN D 25 HYDROXY (VIT D DEFICIENCY, FRACTURES): Vit D, 25-Hydroxy: 43 ng/mL (ref 30–100)

## 2024-05-05 ENCOUNTER — Other Ambulatory Visit: Payer: Self-pay | Admitting: *Deleted

## 2024-05-05 DIAGNOSIS — M810 Age-related osteoporosis without current pathological fracture: Secondary | ICD-10-CM

## 2024-05-05 MED ORDER — DENOSUMAB 60 MG/ML ~~LOC~~ SOSY
60.0000 mg | PREFILLED_SYRINGE | Freq: Once | SUBCUTANEOUS | Status: AC
Start: 2024-05-19 — End: ?

## 2024-05-28 ENCOUNTER — Ambulatory Visit

## 2024-06-06 ENCOUNTER — Ambulatory Visit: Admitting: Family Medicine

## 2024-06-06 ENCOUNTER — Encounter: Payer: Self-pay | Admitting: Family Medicine

## 2024-06-06 VITALS — BP 110/70 | HR 65 | Temp 98.6°F | Resp 18 | Ht 66.25 in | Wt 156.0 lb

## 2024-06-06 DIAGNOSIS — R300 Dysuria: Secondary | ICD-10-CM | POA: Diagnosis not present

## 2024-06-06 DIAGNOSIS — N3001 Acute cystitis with hematuria: Secondary | ICD-10-CM | POA: Diagnosis not present

## 2024-06-06 DIAGNOSIS — R519 Headache, unspecified: Secondary | ICD-10-CM

## 2024-06-06 LAB — POCT URINALYSIS DIP (CLINITEK)
Glucose, UA: NEGATIVE mg/dL
Ketones, POC UA: NEGATIVE mg/dL
Nitrite, UA: NEGATIVE
POC PROTEIN,UA: 100 — AB
Spec Grav, UA: >=1.03 — AB (ref 1.010–1.025)
Urobilinogen, UA: 0.2 U/dL
pH, UA: 6 (ref 5.0–8.0)

## 2024-06-06 MED ORDER — NITROFURANTOIN MONOHYD MACRO 100 MG PO CAPS: 100.0000 mg | ORAL_CAPSULE | Freq: Two times a day (BID) | ORAL | 0 refills | Status: AC

## 2024-06-06 NOTE — Assessment & Plan Note (Signed)
 UA - completed in office today - Urine sent for culture

## 2024-06-06 NOTE — Assessment & Plan Note (Signed)
 UTI Patient here with symptoms of urgency, frequency and dysuria that started yesterday. UA in the office showed Lab Results  Component Value Date   COLORU yellow (A) 06/06/2024   CLARITYU cloudy (A) 06/06/2024   GLUCOSEUR negative 06/06/2024   BILIRUBINUR small (A) 06/06/2024   SPECGRAV >=1.030 (A) 06/06/2024   RBCUR large (A) 06/06/2024   PHUR 6.0 06/06/2024   PROTEINUR NEGATIVE 02/14/2016   UROBILINOGEN 0.2 06/06/2024   LEUKOCYTESUR Large (3+) (A) 06/06/2024    Suprapubic tenderness noted on exam.  She does not appear septic, has no flank tenderness.  Vitals reassuring.   Plan: Symptoms suggestive of urinary tract infection. I have prescribed Macrobid  100 mg by mouth TWICE A DAY for 5 days. Your symptoms should gradually improve. Call if the burning worsens, you develop a fever, back pain or pelvic pain or if your symptom do not resolve after completing the antibiotic. Drink plenty of fluids Complete the full course of antibiotics even if the symptoms resolve Remember to wipe from front to back and don't hold it in! If possible, empty your bladder every 4 hours. Advised to watch out for any fever/chills/back pain and to go to the emergency room if she does for evaluation for pyelonephritis and IV fluids and IV antibiotics.

## 2024-06-06 NOTE — Progress Notes (Signed)
 Acute Office Visit  Subjective:    Patient ID: Allison Neal, female    DOB: 11/05/1965, 58 y.o.   MRN: [id number]  Chief Complaint  Patient presents with   Urinary Tract Infection    Discussed the use of AI scribe software for clinical note transcription with the patient, who gave verbal consent to proceed.  History of Present Illness   Allison Neal is a 58 year old female who presents with urinary symptoms and headache.  She has noticed her urine has been cloudy. She has a history of a similar episode in the past. There have been no recent changes in laundry detergents or body washes, although she did use a body wash earlier in the week, which she typically avoids. She generally uses Norwex cloths for bathing.  She describes suprapubic pressure and urinary frequency and urgency, but no dysuria. No fever, gastrointestinal symptoms, nausea, vomiting, diarrhea, or constipation. She maintains hydration by drinking water consistently throughout the day, using a forty-ounce cup. She does not recall taking Macrobid  before and generally avoids taking medications unless necessary.  She has been experiencing headaches for the past two days, which she attributes to seasonal changes. She has been managing the headaches with ibuprofen and Tylenol, noting that they usually resolve after two days.       Past Medical History:  Diagnosis Date   Broken foot SUMMER 2011   RIGHT   Environmental allergies    Osteoporosis    Vitamin D  deficiency     Past Surgical History:  Procedure Laterality Date   CERVICAL DISC SURGERY  2009   ENDOMETRIAL ABLATION  2010   GALLBLADDER SURGERY  2007   TONSILLECTOMY  2004   UMBILICAL HERNIA REPAIR     VAGINAL HYSTERECTOMY  11-25-09    Family History  Problem Relation Age of Onset   Diabetes Mother    Heart disease Mother    Cancer Mother        ??UTERINE CANCER OR FIBROIDS??   Diabetes Maternal Grandmother    Thyroid  disease Father    Heart  disease Father    Cancer Father        ? Testicular    Social History   Socioeconomic History   Marital status: Married    Spouse name: Not on file   Number of children: Not on file   Years of education: Not on file   Highest education level: Not on file  Occupational History   Not on file  Tobacco Use   Smoking status: Never   Smokeless tobacco: Never  Vaping Use   Vaping status: Never Used  Substance and Sexual Activity   Alcohol use: No    Alcohol/week: 0.0 standard drinks of alcohol   Drug use: No   Sexual activity: Yes    Birth control/protection: Surgical    Comment: 1st intercourse 58 yo-Fewer than 5 partners  Other Topics Concern   Not on file  Social History Narrative   Not on file   Social Drivers of Health   Financial Resource Strain: Not on file  Food Insecurity: Not on file  Transportation Needs: Not on file  Physical Activity: Not on file  Stress: Not on file  Social Connections: Not on file  Intimate Partner Violence: Not on file    No outpatient medications prior to visit.   Facility-Administered Medications Prior to Visit  Medication Dose Route Frequency Provider Last Rate Last Admin   denosumab  (PROLIA ) injection 60 mg  60 mg Subcutaneous  Once Dallie Vera GAILS, MD        Allergies  Allergen Reactions   Other     DUST, MOLD, CATS    Review of Systems  Constitutional:  Negative for chills, diaphoresis, fatigue and fever.  HENT:  Negative for congestion, ear pain and sinus pain.   Respiratory:  Negative for cough and shortness of breath.   Cardiovascular:  Negative for chest pain and palpitations.  Gastrointestinal:  Negative for abdominal pain, constipation, diarrhea, nausea and vomiting.  Genitourinary:  Positive for dysuria, frequency and urgency.  Musculoskeletal:  Negative for arthralgias.  Neurological:  Negative for weakness and headaches.  Psychiatric/Behavioral:  Negative for dysphoric mood. The patient is not nervous/anxious.         Objective:        06/06/2024    9:09 AM 04/01/2024    8:05 AM 03/29/2023   10:16 AM  Vitals with BMI  Height 5' 6.25 5' 6.25 5' 6.5  Weight 156 lbs 150 lbs 147 lbs  BMI 24.98 24.02 23.37  Systolic 110 104 879  Diastolic 70 72 84  Pulse 65 64 59    No data found.   Physical Exam Vitals reviewed.  Constitutional:      General: She is not in acute distress.    Appearance: Normal appearance. She is not ill-appearing.  Eyes:     Conjunctiva/sclera: Conjunctivae normal.  Cardiovascular:     Rate and Rhythm: Normal rate and regular rhythm.     Heart sounds: Normal heart sounds. No murmur heard. Pulmonary:     Effort: Pulmonary effort is normal.     Breath sounds: Normal breath sounds. No wheezing.  Abdominal:     Palpations: Abdomen is soft.     Tenderness: There is abdominal tenderness (suprapubic).  Musculoskeletal:        General: Normal range of motion.  Skin:    General: Skin is warm.  Neurological:     Mental Status: She is alert. Mental status is at baseline.  Psychiatric:        Mood and Affect: Mood normal.        Behavior: Behavior normal.     Health Maintenance Due  Topic Date Due   HIV Screening  Never done   Pneumococcal Vaccine: 50+ Years (1 of 1 - PCV) Never done   Zoster Vaccines- Shingrix (1 of 2) Never done   Colonoscopy  01/31/2024    There are no preventive care reminders to display for this patient.   Lab Results  Component Value Date   TSH 1.86 04/01/2024   Lab Results  Component Value Date   WBC 5.3 04/01/2024   HGB 14.5 04/01/2024   HCT 45.6 (H) 04/01/2024   MCV 89.4 04/01/2024   PLT 277 04/01/2024   Lab Results  Component Value Date   NA 141 04/01/2024   K 4.7 04/01/2024   CO2 26 04/01/2024   GLUCOSE 85 04/01/2024   BUN 15 04/01/2024   CREATININE 0.63 04/01/2024   BILITOT 0.7 04/01/2024   ALKPHOS 54 03/16/2020   AST 25 04/01/2024   ALT 23 04/01/2024   PROT 7.5 04/01/2024   ALBUMIN 4.5 03/16/2020   CALCIUM  9.5 04/01/2024   Lab Results  Component Value Date   CHOL 200 (H) 04/01/2024   Lab Results  Component Value Date   HDL 67 04/01/2024   Lab Results  Component Value Date   LDLCALC 112 (H) 04/01/2024   Lab Results  Component Value Date  TRIG 99 04/01/2024   Lab Results  Component Value Date   CHOLHDL 3.0 04/01/2024   No results found for: HGBA1C     Assessment & Plan:  Acute cystitis with hematuria Assessment & Plan: UTI Patient here with symptoms of urgency, frequency and dysuria that started yesterday. UA in the office showed Lab Results  Component Value Date   COLORU yellow (A) 06/06/2024   CLARITYU cloudy (A) 06/06/2024   GLUCOSEUR negative 06/06/2024   BILIRUBINUR small (A) 06/06/2024   SPECGRAV >=1.030 (A) 06/06/2024   RBCUR large (A) 06/06/2024   PHUR 6.0 06/06/2024   PROTEINUR NEGATIVE 02/14/2016   UROBILINOGEN 0.2 06/06/2024   LEUKOCYTESUR Large (3+) (A) 06/06/2024    Suprapubic tenderness noted on exam.  She does not appear septic, has no flank tenderness.  Vitals reassuring.   Plan: Symptoms suggestive of urinary tract infection. I have prescribed Macrobid  100 mg by mouth TWICE A DAY for 5 days. Your symptoms should gradually improve. Call if the burning worsens, you develop a fever, back pain or pelvic pain or if your symptom do not resolve after completing the antibiotic. Drink plenty of fluids Complete the full course of antibiotics even if the symptoms resolve Remember to wipe from front to back and don't hold it in! If possible, empty your bladder every 4 hours. Advised to watch out for any fever/chills/back pain and to go to the emergency room if she does for evaluation for pyelonephritis and IV fluids and IV antibiotics.   Orders: -     Nitrofurantoin  Monohyd Macro; Take 1 capsule (100 mg total) by mouth 2 (two) times daily for 5 days.  Dispense: 10 capsule; Refill: 0  Dysuria Assessment & Plan: UA - completed in office today - Urine sent  for culture  Orders: -     POCT URINALYSIS DIP (CLINITEK) -     Urine Culture  Acute nonintractable headache, unspecified headache type Assessment & Plan: Headache Headache possibly related to seasonal changes, managed with ibuprofen and acetaminophen. - Continue ibuprofen and acetaminophen as needed.          Meds ordered this encounter  Medications   nitrofurantoin , macrocrystal-monohydrate, (MACROBID ) 100 MG capsule    Sig: Take 1 capsule (100 mg total) by mouth 2 (two) times daily for 5 days.    Dispense:  10 capsule    Refill:  0    Orders Placed This Encounter  Procedures   Urine Culture   POCT URINALYSIS DIP (CLINITEK)    Follow-up: Return if symptoms worsen or fail to improve.  An After Visit Summary was printed and given to the patient.  Harrie Cedar, FNP [facility] [phone]

## 2024-06-06 NOTE — Assessment & Plan Note (Signed)
 Headache Headache possibly related to seasonal changes, managed with ibuprofen and acetaminophen. - Continue ibuprofen and acetaminophen as needed.

## 2024-06-06 NOTE — Patient Instructions (Signed)
  VISIT SUMMARY: Today, you were seen for urinary symptoms and a headache. You reported cloudy urine, suprapubic pressure, and urinary frequency and urgency, but no pain during urination or other symptoms. You also mentioned experiencing headaches for the past two days, which you believe are due to seasonal changes. Your urinalysis showed signs of a urinary tract infection, and you were prescribed medication to treat it.  YOUR PLAN: -URINARY TRACT INFECTION: A urinary tract infection (UTI) is an infection in any part of your urinary system, which includes your kidneys, bladder, and urethra. You have been prescribed Macrobid  100 mg to take twice daily for 5 days to treat the infection. A urine sample has been sent for culture to confirm the best antibiotic for your infection. Please continue to drink plenty of water to help flush out the bacteria.  -HEADACHE: Your headaches are likely related to seasonal changes. You can continue to manage them with ibuprofen and acetaminophen as needed.  INSTRUCTIONS: Please take Macrobid  100 mg twice daily for 5 days as prescribed. Continue to drink plenty of water. Use ibuprofen and acetaminophen as needed for your headaches. We will contact you with the results of your urine culture to confirm the best antibiotic for your infection.                      Contains text generated by [employer]bridge.                                 Contains text generated by [employer]bridge.

## 2024-06-13 LAB — URINE CULTURE

## 2024-06-16 ENCOUNTER — Ambulatory Visit: Payer: Self-pay | Admitting: Family Medicine

## 2024-06-16 ENCOUNTER — Other Ambulatory Visit: Payer: Self-pay | Admitting: Family Medicine

## 2024-06-16 ENCOUNTER — Encounter: Payer: Self-pay | Admitting: Family Medicine

## 2024-06-16 MED ORDER — CIPROFLOXACIN HCL 500 MG PO TABS: 500.0000 mg | ORAL_TABLET | Freq: Two times a day (BID) | ORAL | 0 refills | Status: AC

## 2024-07-23 ENCOUNTER — Telehealth: Payer: Self-pay | Admitting: *Deleted

## 2024-07-23 NOTE — Telephone Encounter (Signed)
 Patient cancelled Prolia  injection on 05/28/24 via MyChart:  Cancel Rsn: Canceled via MyChart (I don't like the possible side affects, and definitely don't want to take it for life.)       Please advise.

## 2024-07-30 NOTE — Telephone Encounter (Signed)
 Call to patient. Patient states she desires to wait and discuss at next annual exam. States she does not want to take anything with potential side effects at this time.

## 2025-04-06 ENCOUNTER — Ambulatory Visit: Admitting: Obstetrics and Gynecology
